# Patient Record
Sex: Female | Born: 1965
Health system: Southern US, Community
[De-identification: ages and names within clinical notes are randomized; demographics above are authoritative.]

## PROBLEM LIST (undated history)

## (undated) DIAGNOSIS — F419 Anxiety disorder, unspecified: Secondary | ICD-10-CM

## (undated) DIAGNOSIS — N3281 Overactive bladder: Secondary | ICD-10-CM

## (undated) DIAGNOSIS — E119 Type 2 diabetes mellitus without complications: Secondary | ICD-10-CM

## (undated) HISTORY — PX: TUBAL LIGATION: SHX77

## (undated) HISTORY — DX: Overactive bladder: N32.81

## (undated) HISTORY — DX: Anxiety disorder, unspecified: F41.9

---

## 1999-09-11 ENCOUNTER — Other Ambulatory Visit: Admission: RE | Admit: 1999-09-11 | Discharge: 1999-09-11 | Payer: Self-pay | Admitting: *Deleted

## 2001-02-03 ENCOUNTER — Inpatient Hospital Stay (HOSPITAL_COMMUNITY): Admission: AD | Admit: 2001-02-03 | Discharge: 2001-02-03 | Payer: Self-pay | Admitting: Obstetrics & Gynecology

## 2001-02-04 ENCOUNTER — Inpatient Hospital Stay (HOSPITAL_COMMUNITY): Admission: AD | Admit: 2001-02-04 | Discharge: 2001-02-04 | Payer: Self-pay | Admitting: *Deleted

## 2001-02-10 ENCOUNTER — Inpatient Hospital Stay (HOSPITAL_COMMUNITY): Admission: AD | Admit: 2001-02-10 | Discharge: 2001-02-10 | Payer: Self-pay | Admitting: Obstetrics & Gynecology

## 2001-03-25 ENCOUNTER — Inpatient Hospital Stay (HOSPITAL_COMMUNITY): Admission: AD | Admit: 2001-03-25 | Discharge: 2001-03-26 | Payer: Self-pay | Admitting: Obstetrics & Gynecology

## 2001-04-29 ENCOUNTER — Other Ambulatory Visit: Admission: RE | Admit: 2001-04-29 | Discharge: 2001-04-29 | Payer: Self-pay | Admitting: Obstetrics and Gynecology

## 2002-06-14 ENCOUNTER — Other Ambulatory Visit: Admission: RE | Admit: 2002-06-14 | Discharge: 2002-06-14 | Payer: Self-pay | Admitting: Family Medicine

## 2002-08-17 ENCOUNTER — Encounter: Payer: Self-pay | Admitting: Obstetrics and Gynecology

## 2002-08-17 ENCOUNTER — Ambulatory Visit (HOSPITAL_COMMUNITY): Admission: RE | Admit: 2002-08-17 | Discharge: 2002-08-17 | Payer: Self-pay | Admitting: Obstetrics and Gynecology

## 2003-07-17 ENCOUNTER — Other Ambulatory Visit: Admission: RE | Admit: 2003-07-17 | Discharge: 2003-07-17 | Payer: Self-pay | Admitting: Family Medicine

## 2004-03-01 ENCOUNTER — Other Ambulatory Visit: Admission: RE | Admit: 2004-03-01 | Discharge: 2004-03-01 | Payer: Self-pay | Admitting: Obstetrics and Gynecology

## 2004-08-13 ENCOUNTER — Encounter: Admission: RE | Admit: 2004-08-13 | Discharge: 2004-08-13 | Payer: Self-pay | Admitting: Obstetrics and Gynecology

## 2004-08-14 ENCOUNTER — Encounter: Admission: RE | Admit: 2004-08-14 | Discharge: 2004-08-14 | Payer: Self-pay | Admitting: Internal Medicine

## 2004-08-21 ENCOUNTER — Encounter: Admission: RE | Admit: 2004-08-21 | Discharge: 2004-08-21 | Payer: Self-pay | Admitting: *Deleted

## 2004-08-27 ENCOUNTER — Encounter: Admission: RE | Admit: 2004-08-27 | Discharge: 2004-08-27 | Payer: Self-pay | Admitting: *Deleted

## 2004-09-03 ENCOUNTER — Ambulatory Visit: Payer: Self-pay | Admitting: Obstetrics and Gynecology

## 2004-09-10 ENCOUNTER — Ambulatory Visit: Payer: Self-pay | Admitting: *Deleted

## 2004-09-17 ENCOUNTER — Ambulatory Visit: Payer: Self-pay | Admitting: *Deleted

## 2004-09-24 ENCOUNTER — Ambulatory Visit: Payer: Self-pay | Admitting: *Deleted

## 2004-10-01 ENCOUNTER — Ambulatory Visit: Payer: Self-pay | Admitting: *Deleted

## 2004-10-06 ENCOUNTER — Inpatient Hospital Stay (HOSPITAL_COMMUNITY): Admission: AD | Admit: 2004-10-06 | Discharge: 2004-10-06 | Payer: Self-pay | Admitting: Obstetrics and Gynecology

## 2004-10-08 ENCOUNTER — Ambulatory Visit: Payer: Self-pay | Admitting: *Deleted

## 2004-10-12 ENCOUNTER — Inpatient Hospital Stay (HOSPITAL_COMMUNITY): Admission: AD | Admit: 2004-10-12 | Discharge: 2004-10-14 | Payer: Self-pay | Admitting: Obstetrics and Gynecology

## 2004-11-20 ENCOUNTER — Other Ambulatory Visit: Admission: RE | Admit: 2004-11-20 | Discharge: 2004-11-20 | Payer: Self-pay | Admitting: Obstetrics and Gynecology

## 2004-12-26 ENCOUNTER — Observation Stay (HOSPITAL_COMMUNITY): Admission: RE | Admit: 2004-12-26 | Discharge: 2004-12-27 | Payer: Self-pay | Admitting: Obstetrics and Gynecology

## 2007-06-01 ENCOUNTER — Other Ambulatory Visit: Admission: RE | Admit: 2007-06-01 | Discharge: 2007-06-01 | Payer: Self-pay | Admitting: Gynecology

## 2008-11-09 ENCOUNTER — Ambulatory Visit: Payer: Self-pay | Admitting: Obstetrics and Gynecology

## 2008-11-16 ENCOUNTER — Ambulatory Visit (HOSPITAL_COMMUNITY): Admission: RE | Admit: 2008-11-16 | Discharge: 2008-11-16 | Payer: Self-pay | Admitting: Obstetrics & Gynecology

## 2008-11-30 ENCOUNTER — Ambulatory Visit: Payer: Self-pay | Admitting: Obstetrics and Gynecology

## 2009-01-19 ENCOUNTER — Encounter: Admission: RE | Admit: 2009-01-19 | Discharge: 2009-01-19 | Payer: Self-pay | Admitting: Pulmonary Disease

## 2009-11-14 ENCOUNTER — Emergency Department (HOSPITAL_COMMUNITY): Admission: EM | Admit: 2009-11-14 | Discharge: 2009-11-14 | Payer: Self-pay | Admitting: Family Medicine

## 2009-12-13 ENCOUNTER — Emergency Department (HOSPITAL_COMMUNITY): Admission: EM | Admit: 2009-12-13 | Discharge: 2009-12-13 | Payer: Self-pay | Admitting: Family Medicine

## 2010-04-12 ENCOUNTER — Ambulatory Visit: Payer: Self-pay | Admitting: Gynecology

## 2010-08-02 ENCOUNTER — Ambulatory Visit: Payer: Self-pay | Admitting: Women's Health

## 2011-05-13 NOTE — Group Therapy Note (Signed)
NAME:  Paige Villarreal, Paige Villarreal                    ACCOUNT NO.:  [id number]   MEDICAL RECORD NO.:  [id number]          PATIENT TYPE:  WOC   LOCATION:  [facility]                   FACILITY:  [facility]   PHYSICIAN:  Argentina Donovan, MD        DATE OF BIRTH:  [birth year]   DATE OF SERVICE:                                  CLINIC NOTE   The patient is a 45 year old Caucasian female who had a tubal ligation  some time ago by Dr. Lily Peer.  She had been constantly complaining of  lower abdominal pain, urinary incontinence not related to stress,  increase in weight and abdominal girth.  She had an ultrasound of the  pelvis a year ago which was completely normal.  She was told she had a  fibroid, but that was during the pregnancy, and it apparently has  shrunken down to where it is not visible.  She has been on Enablex and  Effexor, and she has recently stopped the Enablex on her own, as she  felt that this was causing her weight gain;  I do not think so.  If  there is anything that is causing it from her medications, it is  probably Effexor.  She was put on that by Dr. Lily Peer for her anxiety,  and it has worked fairly.   PHYSICAL EXAMINATION:  Her exam was fairly normal, see my previous note.  with a little suprapubic tenderness.  I had to come back today for  results of her CT of the pelvis and abdomen, and it was completely  normal, and also decide where to send her.  I think we should start with  the urologist because of her urinary incontinence and suprapubic pain.  I do not see any significant signs of gastric distension on the CT, and  the abdominal exam was benign.  I am going to try her on some Levsin.  She tried charcoal without any can help.  She has regular bowel  movements and is not bothered by constipation.  I counseled her to try  to get into Weight Watchers.  She said that she has friends who have  done that and been successful, so she is going to try and do that.  She  has stop smoking, and of  course had a 15-pound weight gain after that.  She certainly does not look grossly overweight, and I think a lot of  this weight gain that she says she is having in spite of watching her  diet may be imaginary.  I am going to try and talk to her about Xenical  and Alli, and I will give her a prescription for Xenical.  I warned her  about the possible side effects of loose stools, explosive diarrhea,  etc., if she overdid any fat intake, and she seems to understand that,  as well as vitamins.  She is also on multivitamins at the present time  and calcium.  She also takes potassium.  I told her I did not think that  that was necessary.  I did not know why she took it.  She said she was  placed on it years ago for leg cramps, and she does not have cramps when  she takes it, but she does when she stops it, so I am not sure exactly  what that is; it is an over-the-counter calcium product, she states, and  she does not know how much it in it.   IMPRESSION:  Mild suprapubic pain with mild urinary incontinence and  abdominal discomfort, gaseous distension per patient, with unexplained  weight gain.           ______________________________  Argentina Donovan, MD     PR/MEDQ  D:  11/30/2008  T:  11/30/2008  Job:  [id number]

## 2011-05-13 NOTE — Group Therapy Note (Signed)
NAME:  Paige Villarreal, Paige Villarreal                    ACCOUNT NO.:  [id number]   MEDICAL RECORD NO.:  [id number]          PATIENT TYPE:  WOC   LOCATION:  [facility]                   FACILITY:  [facility]   PHYSICIAN:  Argentina Donovan, MD        DATE OF BIRTH:  November 20, 1966   DATE OF SERVICE:  11/09/2008                                  CLINIC NOTE   The patient is a 45 year old Caucasian female who was a patient of Dr.  Lily Peer, who did a tubal ligation some time ago.  In the past year she  has been complaining of lower abdominal pain, she has been having  urinary incontinence not related to stress, jumping, coughing, laughing  and an increase in weight and abdominal girth.  With this complaint 1  year ago, she had an ultrasound of the pelvis which was essentially  normal with normal size uterus and normal ovaries.  She has continued to  have this problem.  It sounds as if she also has some digestive problems  although she has normal bowel movements every day but frequency of  urination has been a big problem.  They told her she probably had an  unstable bladder.  They put her on and the Enablex and Effexor.  She is  5 feet 2 and weighs 155 pounds. Said she has recently in the last few  months gained 18 pounds.  She was actually sent to [place] by the cancer  center after Pap smear, they notice nodules on her cervix.   On examination her abdomen is somewhat distended but tympanic with  active bowel sounds, tender in the suprapubic area but not right above  the symphysis but a little further up perhaps 5 cm.  There is no  guarding.  There is no rebound there and so it is in the midportion  there and slightly to the right.  Notably when she had a tubal ligation,  they had to keep her for 2 days because of uncontrollable pain with no  significant reason that was known for this.   On examination her external genitalia is normal.  BUN is within normal  limits.  Vagina is clean and well ruggated.  Cervix is clean and  parous  and I see no sign of Nabothian or other nodules that were described as  the reason for sending her here.  I am not sure exactly what is wrong  with this lady.  She could have interstitial cystitis.  She could have  some kind of spastic colon.  She wanted something she could use for gas.  I recommended charcoal capsules.  I am going to get a CT of her abdomen  and pelvis with and without contrast, have her come back in 2 weeks and  then we will decide what to do with her.  Do we do a laparoscopy which I  think is going to turn out to be negative?  Do we refer her to a  urologist which I think she probably is going to eventually need or  should gastroenterology be our first stop?  Will  try to make this  decision after we see the results of her CT.           ______________________________  Argentina Donovan, MD     PR/MEDQ  D:  11/09/2008  T:  11/10/2008  Job:  [id number]

## 2011-05-16 NOTE — H&P (Signed)
[facility]  Patient:    Paige Villarreal, Paige Villarreal                             MRN: [id number] Adm. Date:  [id number] Attending:  Genia Del Dictator:   Genia Del, M.D.                         History and Physical  PATIENT PROFILE:              The patient is a 45 year old G3, P0, A2, expected date of delivery by ultrasound April 03, 2001, at 38 weeks 5 days gestation.  REASON FOR ADMISSION:         Uterine contractions every three minutes since 1 a.m. on March 25, 2001.  HISTORY OF PRESENT ILLNESS:   The patient started feeling increasing pain with uterine contractions becoming regular every three minutes at 1 a.m.  She had no vaginal bleeding, no fluid leak, but was mentioning a little bit of greenish secretions.  Good fetal movements.  No PIH symptoms.  PAST MEDICAL HISTORY:         Negative.  PAST SURGICAL HISTORY:        TAB x 2 and adenoidectomy.  ALLERGIES:                    No known drug allergies.  MEDICATIONS:                  Prenatal vitamins.  SOCIAL HISTORY:               Married.  Smoker, decreased the amount of smoking during pregnancy.  OBSTETRICAL HISTORY:          In 1992 and 1995 therapeutic abortions x 2, no complication.  FAMILY HISTORY:               Noncontributory.  HISTORY OF PRESENT PREGNANCY:                    First trimester was within normal.  She was seen first at 9+ weeks.  Her first trimester labs showed a hemoglobin of 13.4, blood group B positive, antibody is negative, RPR nonreactive, HBsAg negative, HIV negative, rubella titer is equivocal, platelets 308, gonorrhea and Chlamydia negative.  In the second trimester she had a triple test which was within normal limits.  At 19+ weeks she had an ultrasound with normal review of anatomy, normal placenta and a cervix at 2.8 cm.  Ultrasound dating was used.  One-hour GTT was abnormal, three-hour GTT was within normal limits in the third trimester.  She showed  preterm cervical change at 31 weeks, her cervix being at 1 cm, 90% effaced, vertex -1.  She was given betamethasone at that time and was put on bed rest.  At 34+ weeks group B strep was done and it came back negative.  Her cervix remained stable at that dilatation and effacement until she reached 37+ weeks.  An ultrasound at 37+ weeks showed an estimated fetal weight at the 46th percentile with normal amniotic fluid index at the 60th percentile and normal Doppler.  REVIEW OF SYSTEMS:            Constitutional: Negative.  HEENT: Negative. Cardiovascular: Negative.  Respiratory: Negative.  GI: Negative.  Urologic: Negative.  Dermatologic: Negative.  Musculoskeletal: Negative.  Endocrine: Negative.  Neurologic: Negative.  PHYSICAL EXAMINATION:  GENERAL:                      In extreme pain with uterine contractions, but no apparent distress otherwise.  VITAL SIGNS:                  Temperature 98.9, pulse 87 and then 78, respiratory rate 22, blood pressure 154/96 in pain and then back to normal with a diastolic of 72.  LUNGS:                        Clear bilaterally.  HEART:                        Regular cardiac rhythm, no murmur.  ABDOMEN:                      Gravid with a uterine height at 38 cm, cephalic presentation.  VAGINAL EXAM:                 At maternity admission the cervix was 3-4 cm, 90% effaced, vertex -1.  Membranes were ruptured with clear fluid, confirmed by a positive fern.  EXTREMITIES:                  Lower limbs mild edema.  MONITORING:                   Uterine contractions every 2-3 minutes lasting 40-80 seconds and moderate in intensity.  Fetal heart rate 140 to 160, good variability, no decelerations.  IMPRESSION:                   1. Gravida 3, para 0, abortus 2 at 38 weeks                                  5 days gestation by ultrasound, in active                                  labor.                               2. Spontaneous rupture of  membranes.                               3. Fetal well-being reassuring.                               4. Group B Streptococcus negative.  PLAN:                         Admit to labor and delivery, continuous monitoring.  Expectant management towards probable vaginal delivery. DD:  03/25/01 TD:  03/25/01 Job: [id number]

## 2011-05-16 NOTE — H&P (Signed)
NAME:  Paige Villarreal, Paige Villarreal                    ACCOUNT NO.:  [id number]   MEDICAL RECORD NO.:  [id number]          PATIENT TYPE:  WOC   LOCATION:  [facility]                          FACILITY:  [facility]   PHYSICIAN:  James A. Ashley Royalty, M.D.DATE OF BIRTH:  05-22-1966   DATE OF ADMISSION:  12/26/2004  DATE OF DISCHARGE:  10/08/2004                                HISTORY & PHYSICAL   HISTORY OF PRESENT ILLNESS:  This is a 45 year old gravida 4, para 2, AB 2  status post vaginal delivery October 12, 2004.  The patient states desire  for attempt at permanent surgical sterilization.   MEDICATIONS:  None.   PAST MEDICAL HISTORY:  Medical:  Irritable bowel syndrome.  Surgical:  Negative.   ALLERGIES:  Negative.   FAMILY HISTORY:  Positive for hypertension.   SOCIAL HISTORY:  The patient is a smoker.  She denies significant alcohol  use.   REVIEW OF SYSTEMS:  Noncontributory.   PHYSICAL EXAMINATION:  GENERAL:  A well-developed, well-nourished, pleasant  white female in no acute distress.  VITAL SIGNS:  Afebrile.  Vital signs stable.  SKIN:  Warm and dry without lesions.  LYMPH:  There is no supraclavicular, cervical, or inguinal adenopathy.  HEENT:  Normocephalic.  NECK:  Supple without thyromegaly.  CHEST:  Lungs are clear.  CARDIAC:  Rate and rhythm regular without murmurs, gallops, or rubs.  BREASTS:  Deferred.  ABDOMEN:  Soft, nontender without masses or organomegaly.  Bowel sounds are  active.  MUSCULOSKELETAL:  Full range of motion without edema, cyanosis, or CVA  tenderness.  PELVIC:  Please see most recent office exam.  External genitalia within  normal limits.  Vagina and cervix are without gross lesions.  Bimanual  examination reveals the uterus to be approximately 8 x 4 x 4 cm and no  adnexal masses are palpable.   IMPRESSION:  Desire for attempt at permanent surgical sterilization.   PLAN:  Laparoscopic bilateral tubal sterilization procedure.  Risks,  benefits, complications, and  alternatives were discussed with the patient.  The permanency and failure rates of various techniques including but not  limited to Falope ring, bipolar cautery, and minilaparotomy with partial  salpingectomy discussed and accepted.  Questions identified and answered.      JAM/MEDQ  D:  12/25/2004  T:  12/25/2004  Job:  [id number]

## 2011-05-16 NOTE — H&P (Signed)
NAME:  Paige Villarreal, Paige Villarreal                    ACCOUNT NO.:  [id number]   MEDICAL RECORD NO.:  [id number]          PATIENT TYPE:  WOC   LOCATION:  [facility]                          FACILITY:  [facility]   PHYSICIAN:  Juan H. Lily Peer, M.D.DATE OF BIRTH:  Jul 17, 1966   DATE OF ADMISSION:  10/08/2004  DATE OF DISCHARGE:                                HISTORY & PHYSICAL   CHIEF COMPLAINT:  Spontaneous rupture of membranes with contractions.   HISTORY OF PRESENT ILLNESS:  The patient is a 45 year old, G4, P62, AB2,  currently 38-1/2 weeks' estimated gestational age with an estimated date of  confinement of October 23, 2004.  The patient was scheduled for induction  next week.  She presented to [facility] complaining of spontaneous  rupture of membranes at approximately 2000 hours this evening and  contractions.  On arrival, she was found to be 4 cm dilated, 90% effaced, +1  station with light meconium contracting every 2-3 minutes apart with a  reassuring fetal heart rate tracing.  Her temperature is 98.4, respirations  14, pulse 76, blood pressure 142/73.   PRENATAL COURSE:  The patient was offered genetic amniocentesis and declined  secondary to her advanced maternal age.  She is a chronic smoker and on  numerous occasions had been counseled and continued to smoke throughout her  pregnancy.  The patient with gestational diabetes, diet-controlled, and had  received nutritional counseling.  She had been followed with prenatal visits  in the office consisting of serial ultrasounds and antepartum testing  consisting of NSTs.   ALLERGIES:  No known drug allergies.   PAST MEDICAL HISTORY:  1.  Elective termination of pregnancy in 1993 and 1996, respectively.  2.  She had a normal spontaneous vaginal delivery at 40 weeks' gestation      with rapid labor.  Preterm labor with this pregnancy as well.  3.  Irritable bowel syndrome.   REVIEW OF SYSTEMS:  See form.   PHYSICAL EXAMINATION:  VITAL SIGNS:  As  described above.  HEENT:  Unremarkable.  NECK:  Supple.  Trachea midline.  No carotid bruits or thyromegaly.  LUNGS:  Clear to auscultation without any rhonchi or wheezes.  HEART:  Regular rate and rhythm without any murmurs, rubs or gallops.  BREASTS:  Not done.  ABDOMEN:  Gravid uterus.  Vertex presentation by [facility] maneuver.  PELVIC:  Cervix 3-4 cm, 90%, +1 station, gross rupture of membranes with  slight meconium.  EXTREMITIES:  Deep tendon reflexes 1+.   PRENATAL LABORATORY DATA:  Blood type B positive.  Negative antibody screen.  VDRL nonreactive.  Rubella immune.  Hepatitis B surface antigen negative.  Pap smear was normal.  GC and Chlamydia culture was negative.  GBS culture  was negative.  Cystic fibrosis screen was negative.  Alpha fetoprotein was  reported to be normal.   ASSESSMENT:  A 45 year old, G4, P1, AB2 at 38-1/2 weeks' estimated  gestational age with spontaneous rupture of membranes at 2000 hours today,  October 15, in active labor and advanced cervical dilatation consisting with  cervix 3-4 cm, 90% effaced, +  1 station, light meconium stained amniotic  fluid, reassuring fetal heart rate tracing, contractions every 2-3 minutes  apart.  Group B Streptococcus culture was negative.  Anticipate vaginal  delivery.   PLAN:  As per assessment above.      JHF/MEDQ  D:  10/12/2004  T:  10/13/2004  Job:  [id number]   cc:   Fayrene Fearing A. Ashley Royalty, M.D.  [address]  [place], [place] [address]  Fax: [phone]

## 2011-05-16 NOTE — Discharge Summary (Signed)
NAME:  Paige Villarreal, Paige Villarreal                    ACCOUNT NO.:  [id number]   MEDICAL RECORD NO.:  [id number]          PATIENT TYPE:  INP   LOCATION:  9133                          [facility]   PHYSICIAN:  James A. Ashley Royalty, M.D.DATE OF BIRTH:  [birth year]   DATE OF ADMISSION:  10/12/2004  DATE OF DISCHARGE:  10/14/2004                                 DISCHARGE SUMMARY   DISCHARGE DIAGNOSES:  1.  Intrauterine pregnancy at term, delivered.  2.  Spontaneous rupture of membranes.  3.  Meconium-stained amniotic fluid.  4.  Term birth of living child, vertex.   OPERATIONS AND SPECIAL PROCEDURES:  Obstetrical delivery.   CONSULTATIONS:  None.   DISCHARGE MEDICATIONS:  Chromagen, Motrin.   HISTORY AND PHYSICAL:  This is a 45 year old gravida 4, para 1, Ab2, 38-1/[redacted]  weeks gestation, who presented with the aforementioned findings, who was  admitted for delivery.  For the remainder of the history and physical,  please seed chart.   HOSPITAL COURSE:  The patient was admitted to [facility]  [place].  Admission laboratory studies were drawn.  She went on to labor  and delivered on October 12, 2004.  The infant was a 7-pound 8-ounce female,  Apgars 9 at 1 minute and 9 at 5 minutes, sent to the newborn nursery.  Delivery was accomplished by Dr. Gaetano Hawthorne. Paige Villarreal.  The patient's  postpartum course was benign.  She was discharged on the second postpartum  day, afebrile and in satisfactory condition.   DISPOSITION:  The patient is to return to [facility] and obstetrics  in 4-6 weeks for postpartum evaluation.      JAM/MEDQ  D:  11/21/2004  T:  11/22/2004  Job:  [id number]

## 2011-05-16 NOTE — Op Note (Signed)
NAME:  Paige Villarreal, Paige Villarreal                    ACCOUNT NO.:  [id number]   MEDICAL RECORD NO.:  [id number]          PATIENT TYPE:  OBV   LOCATION:  9320                          [facility]   PHYSICIAN:  James A. Ashley Royalty, M.D.DATE OF BIRTH:  Jan 01, 1966   DATE OF PROCEDURE:  12/26/2004  DATE OF DISCHARGE:                                 OPERATIVE REPORT   PREOPERATIVE DIAGNOSIS:  Desire for attempted permanent surgical  sterilization.   POSTOPERATIVE DIAGNOSIS:  Desire for attempted permanent surgical  sterilization.   PROCEDURE:  Laparoscopic bilateral tubal sterilization procedure (Falope  rings).   SURGEON:  Rudy Jew. Ashley Royalty, M.D.   ANESTHESIA:  General.   ESTIMATED BLOOD LOSS:  25 cc.   COMPLICATIONS:  None.   PACKS AND DRAINS:  None.   DESCRIPTION OF PROCEDURE:  The patient was taken to the operating room and  placed in the dorsal supine position.  After general anesthesia was  administered, she was placed in the lithotomy position and prepped and  draped in the usual manner for abdominal and vaginal surgery.  A posterior  weighted retractor was placed per vagina.  The anterior lip of the cervix  was grasped with a single-tooth tenaculum.  The Charcot uterine manipulator  was placed per cervix and held in place with a tenaculum.  The bladder was  drained with a red rubber catheter.  The draping was completed.   Next, a 1.5-cm infraumbilical incision was made.  The Veress needle was  introduced into the abdominal cavity.  Its location was verified by  instillation of saline and hanging drop techniques.  Approximately 3 L of  CO2 were instilled at 1 L per minute to create a pneumoperitoneum.  The  disposable size 10/11 laparoscopic trocar was placed in the abdominal  cavity.  Its location was verified by placement of the laparoscope.  There  was no evidence of any trauma.  Pneumoperitoneum was maintained throughout.  The pelvic contents were displayed.  The uterus was normal  size, shape, and  contour and without evidence of any endometriosis or fibroids.  The ovaries  were normal size, shape, and contour and without evidence of any cysts or  endometriosis.  The fallopian tubes were normal size, shape, contour, and  length, with luxuriant fimbria.  The remainder of the peritoneal surfaces  were smooth and glistening.  No abnormalities were noted.  The 8-mm Falope  ring trocar was introduced suprapubically using transillumination and direct  visualization techniques.  Its location was in the midline.  The Falope ring  applicator was placed through the trocar.  The right fallopian tube was  grasped and traced to its fimbriated end.  An avascular area in the distal  isthmic to proximal ampullary portion was chosen for ring placement.  The  Falope ring was applied without difficulty.  An excellent knuckle of tube  was noted to be contained within the ring.  Excellent blanching of tissue  was noted.  Appropriate photos were obtained before and after.  The left  fallopian tube was then grasped and traced to its  fimbriated end.  An  avascular area in the distal isthmic to proximal ampullary portion was  chosen for ring placement.  The Falope ring was applied without difficulty.  An excellent knuckle of tube was noted to be contained within the ring.  Excellent blanching of tissue was noted.  Appropriate photos were obtained.   At this point, the patient was felt to have benefited maximally from the  surgical procedure.  The abdominal instruments were removed and  pneumoperitoneum evacuated.  The fascial defect was closed with 0 Vicryl in  an interrupted fashion.  The skin was closed with 3-0 Monocryl in a  subcuticular fashion.  The vaginal instruments were removed.  A small  cervical laceration was easily closed with a 3-0 chromic.  Hemostasis was  noted and the procedure terminated.   The patient tolerated the procedure extremely well and was returned to the   recovery room in good condition.      JAM/MEDQ  D:  12/31/2004  T:  12/31/2004  Job:  [id number]

## 2011-06-17 ENCOUNTER — Other Ambulatory Visit (HOSPITAL_COMMUNITY)
Admission: RE | Admit: 2011-06-17 | Discharge: 2011-06-17 | Disposition: A | Discharge status: Home / Self Care | Payer: PRIVATE HEALTH INSURANCE | Source: Ambulatory Visit | Attending: Gynecology | Admitting: Gynecology

## 2011-06-17 ENCOUNTER — Other Ambulatory Visit: Payer: Self-pay | Admitting: Gynecology

## 2011-06-17 ENCOUNTER — Encounter (INDEPENDENT_AMBULATORY_CARE_PROVIDER_SITE_OTHER): Payer: PRIVATE HEALTH INSURANCE | Admitting: Gynecology

## 2011-06-17 DIAGNOSIS — R635 Abnormal weight gain: Secondary | ICD-10-CM

## 2011-06-17 DIAGNOSIS — Z833 Family history of diabetes mellitus: Secondary | ICD-10-CM

## 2011-06-17 DIAGNOSIS — N949 Unspecified condition associated with female genital organs and menstrual cycle: Secondary | ICD-10-CM

## 2011-06-17 DIAGNOSIS — Z124 Encounter for screening for malignant neoplasm of cervix: Secondary | ICD-10-CM | POA: Insufficient documentation

## 2011-06-17 DIAGNOSIS — Z01419 Encounter for gynecological examination (general) (routine) without abnormal findings: Secondary | ICD-10-CM

## 2011-06-20 ENCOUNTER — Encounter: Payer: Self-pay | Admitting: Gynecology

## 2011-06-24 ENCOUNTER — Other Ambulatory Visit: Payer: Self-pay | Admitting: Gynecology

## 2011-06-24 ENCOUNTER — Ambulatory Visit (INDEPENDENT_AMBULATORY_CARE_PROVIDER_SITE_OTHER): Payer: PRIVATE HEALTH INSURANCE | Admitting: Gynecology

## 2011-06-24 ENCOUNTER — Other Ambulatory Visit: Payer: PRIVATE HEALTH INSURANCE

## 2011-06-24 DIAGNOSIS — R82998 Other abnormal findings in urine: Secondary | ICD-10-CM

## 2011-06-24 DIAGNOSIS — Z833 Family history of diabetes mellitus: Secondary | ICD-10-CM

## 2011-06-24 DIAGNOSIS — N949 Unspecified condition associated with female genital organs and menstrual cycle: Secondary | ICD-10-CM

## 2011-06-24 DIAGNOSIS — Z124 Encounter for screening for malignant neoplasm of cervix: Secondary | ICD-10-CM

## 2011-06-24 DIAGNOSIS — Z13220 Encounter for screening for lipoid disorders: Secondary | ICD-10-CM

## 2011-06-24 DIAGNOSIS — D259 Leiomyoma of uterus, unspecified: Secondary | ICD-10-CM

## 2011-06-24 DIAGNOSIS — N8300 Follicular cyst of ovary, unspecified side: Secondary | ICD-10-CM

## 2011-09-30 LAB — POCT URINALYSIS DIP (DEVICE)
Bilirubin Urine: NEGATIVE
Glucose, UA: NEGATIVE
Hgb urine dipstick: NEGATIVE
Ketones, ur: NEGATIVE
Nitrite: NEGATIVE
Operator id: 297281
Protein, ur: NEGATIVE
Specific Gravity, Urine: 1.015
Urobilinogen, UA: 0.2
pH: 7.5

## 2011-12-02 ENCOUNTER — Other Ambulatory Visit: Payer: Self-pay | Admitting: *Deleted

## 2011-12-02 MED ORDER — FLUOXETINE HCL 10 MG PO CAPS: 10.0000 mg | ORAL_CAPSULE | Freq: Every day | ORAL | Status: DC

## 2011-12-02 NOTE — Telephone Encounter (Signed)
Pt called wanting refill on prozac 10 mg, last office visit on 06/24/11. Please advise.

## 2011-12-02 NOTE — Telephone Encounter (Signed)
PT INFORMED WITH THE BELOW NOTE. 

## 2012-04-21 ENCOUNTER — Other Ambulatory Visit: Payer: Self-pay | Admitting: Gynecology

## 2012-04-21 ENCOUNTER — Ambulatory Visit (INDEPENDENT_AMBULATORY_CARE_PROVIDER_SITE_OTHER): Payer: BC Managed Care – PPO | Admitting: Gynecology

## 2012-04-21 ENCOUNTER — Encounter: Payer: Self-pay | Admitting: Gynecology

## 2012-04-21 VITALS — BP 128/84

## 2012-04-21 DIAGNOSIS — R141 Gas pain: Secondary | ICD-10-CM

## 2012-04-21 DIAGNOSIS — N939 Abnormal uterine and vaginal bleeding, unspecified: Secondary | ICD-10-CM

## 2012-04-21 DIAGNOSIS — R350 Frequency of micturition: Secondary | ICD-10-CM

## 2012-04-21 DIAGNOSIS — R142 Eructation: Secondary | ICD-10-CM

## 2012-04-21 DIAGNOSIS — R140 Abdominal distension (gaseous): Secondary | ICD-10-CM

## 2012-04-21 DIAGNOSIS — N390 Urinary tract infection, site not specified: Secondary | ICD-10-CM

## 2012-04-21 DIAGNOSIS — N926 Irregular menstruation, unspecified: Secondary | ICD-10-CM

## 2012-04-21 DIAGNOSIS — N938 Other specified abnormal uterine and vaginal bleeding: Secondary | ICD-10-CM

## 2012-04-21 LAB — URINALYSIS W MICROSCOPIC + REFLEX CULTURE
Bilirubin Urine: NEGATIVE
Casts: NONE SEEN
Crystals: NONE SEEN
Glucose, UA: NEGATIVE mg/dL
Ketones, ur: NEGATIVE mg/dL
Nitrite: NEGATIVE
Protein, ur: NEGATIVE mg/dL
Specific Gravity, Urine: 1.01 (ref 1.005–1.030)
Urobilinogen, UA: 0.2 mg/dL (ref 0.0–1.0)
pH: 7 (ref 5.0–8.0)

## 2012-04-21 MED ORDER — URIBEL 118 MG PO CAPS: 118.0000 mg | ORAL_CAPSULE | Freq: Four times a day (QID) | ORAL | Status: DC

## 2012-04-21 MED ORDER — NITROFURANTOIN MONOHYD MACRO 100 MG PO CAPS: 100.0000 mg | ORAL_CAPSULE | Freq: Two times a day (BID) | ORAL | Status: AC

## 2012-04-21 NOTE — Progress Notes (Signed)
Patient presented to the office today with several complaints. Her for complaint was dysuria and frequency for the past few days. She denies any fever chills nausea vomiting or any back pain. The second complaints has been irregular bleeding over the course of the past 2 months. Typically her cycles last 25 days. Review of her record indicated that a year ago she had a sonohysterogram and endometrial biopsy which was benign. Patient's had a previous tubal ligation procedure. Her last Pap smear was in 2012 which was normal also.  Exam: Abdomen: Soft nontender no rebound no guarding Pelvic: Bartholin urethra Skene was within normal limits Vagina: No lesions or discharge Uterus: Anteverted difficult to assess due to patient's vaginismus and and guarding. Adnexa: Difficult to assess due to patient's vaginismus Rectal: Not examined  Urinalysis demonstrated 2 numerous to count WBC many bacteria in 3-6 RBC.  Assessment/plan: Since patient feels that she is bloated and for past several months she has had these symptoms different from the urinary tract infection symptoms that she has most recent I would like for her to return to the office next week for an ultrasound for better assessment of her uterus and adnexa. For her urinary tract infection she will be placed on Macrobid one by mouth twice a day for 7 days along with Uribell (anti-spasmodic agent) to take 1 by mouth 3 times a day or 4 times a day for the next 3-4 days. She will maintain a menstrual calendar for the next several months and she will bring to the office when she comes for her annual exam in June or July to determine if she is going to need another sonohysterogram and or endometrial biopsy.

## 2012-04-21 NOTE — Patient Instructions (Signed)

## 2012-04-26 LAB — URINE CULTURE: Colony Count: >=100000

## 2012-04-27 ENCOUNTER — Other Ambulatory Visit: Payer: Self-pay | Admitting: Gynecology

## 2012-04-27 ENCOUNTER — Telehealth: Payer: Self-pay | Admitting: Gynecology

## 2012-04-27 DIAGNOSIS — N390 Urinary tract infection, site not specified: Secondary | ICD-10-CM

## 2012-04-27 MED ORDER — CIPROFLOXACIN HCL 250 MG PO TABS: 250.0000 mg | ORAL_TABLET | Freq: Two times a day (BID) | ORAL | Status: AC

## 2012-04-27 MED ORDER — URIBEL 118 MG PO CAPS: 118.0000 mg | ORAL_CAPSULE | Freq: Four times a day (QID) | ORAL | Status: DC

## 2012-04-27 MED ORDER — CIPROFLOXACIN HCL 250 MG PO TABS: 250.0000 mg | ORAL_TABLET | Freq: Three times a day (TID) | ORAL | Status: DC

## 2012-04-27 NOTE — Telephone Encounter (Signed)
Make sure she is contracepting. I would like for her to take Cipro 250 Mg bid for 3 days since the macrodantin she was taking had intermediate sensitivity to  the organisms isolated. That would cover it. When she returns next week for ultrasound will recheck her urine.

## 2012-04-27 NOTE — Telephone Encounter (Signed)
I e-scribed Uribel per Dr. Glenetta Hew verbal okay.  Patient's u/s is this Friday and she will still be on Cipro so I told her just to come by next week to recheck urine culture.  Order is in system.

## 2012-04-27 NOTE — Telephone Encounter (Addendum)
She said she has had tubal ligation.  She said she talked with you about how she feels pregnant but that you felt like her symptoms were related to her fibroid.  She is coming for u/s on Friday.    I imagine you will want her to wait until next week to re-check urine culture since she will probably still be taking Cipro at u/s?  Will it be okay to refill her Uribel for her as well?

## 2012-04-27 NOTE — Telephone Encounter (Signed)
Patient informed of below.  Patient said that she takes the last of her Macrobid and [employer].  She has noticed in the last two days some back pain right below her waist.  She said she did clean house Saturday and it may just be she did too much be sure is concerned regarding her kidneys.  She also said that when she lapses on taking the Uribel that she still is having bladder spasms.  She said she must admit she is a little concerned what is going to happen tomorrow when she runs out of both the meds.  What to recommend?

## 2012-04-27 NOTE — Telephone Encounter (Signed)
Message copied by Keenan Bachelor on Tue Apr 27, 2012 11:31 AM ------      Message from: Ok Edwards      Created: Mon Apr 26, 2012  4:37 PM       Please inform patient that when she finishes the antibiotic that I placed her on that I would like for her to return for followup urine culture. Her urine culture and sensitivity indicated that the Macrobid which she is on has intermediate sensitivity to the microorganisms isolated. I just want to make sure that her infection has been completely eradicated.

## 2012-04-28 ENCOUNTER — Telehealth: Payer: Self-pay | Admitting: *Deleted

## 2012-04-28 NOTE — Telephone Encounter (Signed)
Coupon for uribel left up front for pt to pick up.

## 2012-04-30 ENCOUNTER — Ambulatory Visit (INDEPENDENT_AMBULATORY_CARE_PROVIDER_SITE_OTHER): Payer: BC Managed Care – PPO | Admitting: Gynecology

## 2012-04-30 ENCOUNTER — Ambulatory Visit (INDEPENDENT_AMBULATORY_CARE_PROVIDER_SITE_OTHER): Payer: BC Managed Care – PPO

## 2012-04-30 DIAGNOSIS — R5383 Other fatigue: Secondary | ICD-10-CM

## 2012-04-30 DIAGNOSIS — R5381 Other malaise: Secondary | ICD-10-CM

## 2012-04-30 DIAGNOSIS — D259 Leiomyoma of uterus, unspecified: Secondary | ICD-10-CM

## 2012-04-30 DIAGNOSIS — R140 Abdominal distension (gaseous): Secondary | ICD-10-CM

## 2012-04-30 DIAGNOSIS — R635 Abnormal weight gain: Secondary | ICD-10-CM

## 2012-04-30 DIAGNOSIS — R29898 Other symptoms and signs involving the musculoskeletal system: Secondary | ICD-10-CM

## 2012-04-30 DIAGNOSIS — R141 Gas pain: Secondary | ICD-10-CM

## 2012-04-30 DIAGNOSIS — M6289 Other specified disorders of muscle: Secondary | ICD-10-CM

## 2012-04-30 DIAGNOSIS — Z8632 Personal history of gestational diabetes: Secondary | ICD-10-CM | POA: Insufficient documentation

## 2012-04-30 DIAGNOSIS — N926 Irregular menstruation, unspecified: Secondary | ICD-10-CM

## 2012-04-30 DIAGNOSIS — N938 Other specified abnormal uterine and vaginal bleeding: Secondary | ICD-10-CM

## 2012-04-30 LAB — CBC WITH DIFFERENTIAL/PLATELET
Basophils Absolute: 0 10*3/uL (ref 0.0–0.1)
Basophils Relative: 1 % (ref 0–1)
Eosinophils Absolute: 0.1 10*3/uL (ref 0.0–0.7)
Eosinophils Relative: 2 % (ref 0–5)
HCT: 38.7 % (ref 36.0–46.0)
Hemoglobin: 12.6 g/dL (ref 12.0–15.0)
Lymphocytes Relative: 23 % (ref 12–46)
Lymphs Abs: 1.9 10*3/uL (ref 0.7–4.0)
MCH: 29.3 pg (ref 26.0–34.0)
MCHC: 32.6 g/dL (ref 30.0–36.0)
MCV: 90 fL (ref 78.0–100.0)
Monocytes Absolute: 0.4 10*3/uL (ref 0.1–1.0)
Monocytes Relative: 5 % (ref 3–12)
Neutro Abs: 5.7 10*3/uL (ref 1.7–7.7)
Neutrophils Relative %: 70 % (ref 43–77)
Platelets: 373 10*3/uL (ref 150–400)
RBC: 4.3 MIL/uL (ref 3.87–5.11)
RDW: 13.2 % (ref 11.5–15.5)
WBC: 8.2 10*3/uL (ref 4.0–10.5)

## 2012-04-30 LAB — COMPREHENSIVE METABOLIC PANEL
ALT: 35 U/L (ref 0–35)
AST: 25 U/L (ref 0–37)
Albumin: 4.4 g/dL (ref 3.5–5.2)
Alkaline Phosphatase: 67 U/L (ref 39–117)
BUN: 7 mg/dL (ref 6–23)
CO2: 30 mEq/L (ref 19–32)
Calcium: 9.1 mg/dL (ref 8.4–10.5)
Chloride: 101 mEq/L (ref 96–112)
Creat: 0.6 mg/dL (ref 0.50–1.10)
Glucose, Bld: 84 mg/dL (ref 70–99)
Potassium: 3.9 mEq/L (ref 3.5–5.3)
Sodium: 137 mEq/L (ref 135–145)
Total Bilirubin: 0.8 mg/dL (ref 0.3–1.2)
Total Protein: 6.7 g/dL (ref 6.0–8.3)

## 2012-04-30 LAB — LIPID PANEL
Cholesterol: 234 mg/dL — ABNORMAL HIGH (ref 0–200)
HDL: 55 mg/dL (ref >39)
LDL Cholesterol: 148 mg/dL — ABNORMAL HIGH (ref 0–99)
Total CHOL/HDL Ratio: 4.3 Ratio
Triglycerides: 155 mg/dL — ABNORMAL HIGH (ref <150)
VLDL: 31 mg/dL (ref 0–40)

## 2012-04-30 LAB — TSH: TSH: 0.772 u[IU]/mL (ref 0.350–4.500)

## 2012-04-30 NOTE — Patient Instructions (Addendum)
Irritable Bowel Syndrome   Irritable Bowel Syndrome (IBS) is caused by a disturbance of normal bowel function. Other terms used are spastic colon, mucous colitis, and irritable colon. It does not require surgery, nor does it lead to cancer. There is no cure for IBS. But with proper diet, stress reduction, and medication, you will find that your problems (symptoms) will gradually disappear or improve. IBS is a common digestive disorder. It usually appears in late adolescence or early adulthood. Women develop it twice as often as men. CAUSES  After food has been digested and absorbed in the small intestine, waste material is moved into the colon (large intestine). In the colon, water and salts are absorbed from the undigested products coming from the small intestine. The remaining residue, or fecal material, is held for elimination. Under normal circumstances, gentle, rhythmic contractions on the bowel walls push the fecal material along the colon towards the rectum. In IBS, however, these contractions are irregular and poorly coordinated. The fecal material is either retained too long, resulting in constipation, or expelled too soon, producing diarrhea. SYMPTOMS  The most common symptom of IBS is pain. It is typically in the lower left side of the belly (abdomen). But it may occur anywhere in the abdomen. It can be felt as heartburn, backache, or even as a dull pain in the arms or shoulders. The pain comes from excessive bowel-muscle spasms and from the buildup of gas and fecal material in the colon. This pain:  Can range from sharp belly (abdominal) cramps to a dull, continuous ache.   Usually worsens soon after eating.   Is typically relieved by having a bowel movement or passing gas.  Abdominal pain is usually accompanied by constipation. But it may also produce diarrhea. The diarrhea typically occurs right after a meal or upon arising in the morning. The stools are typically soft and watery. They  are often flecked with secretions (mucus). Other symptoms of IBS include:  Bloating.   Loss of appetite.   Heartburn.   Feeling sick to your stomach (nausea).   Belching   Vomiting   Gas.  IBS may also cause a number of symptoms that are unrelated to the digestive system:  Fatigue.   Headaches.   Anxiety   Shortness of breath   Difficulty in concentrating.   Dizziness.  These symptoms tend to come and go. DIAGNOSIS  The symptoms of IBS closely mimic the symptoms of other, more serious digestive disorders. So your caregiver may wish to perform a variety of additional tests to exclude these disorders. He/she wants to be certain of learning what is wrong (diagnosis). The nature and purpose of each test will be explained to you. TREATMENT A number of medications are available to help correct bowel function and/or relieve bowel spasms and abdominal pain. Among the drugs available are:  Mild, non-irritating laxatives for severe constipation and to help restore normal bowel habits.   Specific anti-diarrheal medications to treat severe or prolonged diarrhea.   Anti-spasmodic agents to relieve intestinal cramps.   Your caregiver may also decide to treat you with a mild tranquilizer or sedative during unusually stressful periods in your life.  The important thing to remember is that if any drug is prescribed for you, make sure that you take it exactly as directed. Make sure that your caregiver knows how well it worked for you. HOME CARE INSTRUCTIONS   Avoid foods that are high in fat or oils. Some examples [id number] cream, butter, frankfurters, sausage, and  other fatty meats.   Avoid foods that have a laxative effect, such as fruit, fruit juice, and dairy products.   Cut out carbonated drinks, chewing gum, and "gassy" foods, such as beans and cabbage. This may help relieve bloating and belching.   Bran taken with plenty of liquids may help relieve constipation.   Keep track  of what foods seem to trigger your symptoms.   Avoid emotionally charged situations or circumstances that produce anxiety.   Start or continue exercising.   Get plenty of rest and sleep.  MAKE SURE YOU:   Understand these instructions.   Will watch your condition.   Will get help right away if you are not doing well or get worse.  Document Released: 12/15/2005 Document Revised: 12/04/2011 Document Reviewed: 08/04/2008  .Lactose Intolerance, Adult  Lactose intolerance is when the body is not able to digest lactose, a sugar found in milk and milk products. Lactose intolerance is caused by your body not producing enough of the enzyme lactase. When there is not enough lactase to digest the amount of lactose consumed, discomfort may be felt. Lactose intolerance is not a milk allergy. For most people, lactase deficiency is a condition that develops naturally over time. After about the age of 2, the body begins to produce less lactase. But many people may not experience symptoms until they are much older. CAUSES Things that can cause you to be lactose intolerant include:  Aging.   Being born without the ability to make lactase.   Certain digestive diseases.   Injuries to the small intestine.  SYMPTOMS   Feeling sick to your stomach (nauseous).   Diarrhea.   Cramps.   Bloating.   Gas.  Symptoms usually show up a half hour or 2 hours after eating or drinking products containing lactose. TREATMENT  No treatment can improve the body's ability to produce lactase. However, symptoms can be controlled through diet. A medicine may be given to you to take when you consume lactose-containing foods or drinks. The medicine contains the lactase enzyme, which help the body digest lactose better. HOME CARE INSTRUCTIONS  Eat or drink dairy products as told by your caregiver or dietician.   Take all medicine as directed by your caregiver.   Find lactose-free or lactose-reduced products at  your local grocery store.   Talk to your caregiver or dietician to decide if you need any dietary supplements.  The following is the amount of calcium needed from the diet:  19 to 50 years: 1000 mg   Over 50 years: 1200 mg  Calcium and Lactose in Common Foods Non-Dairy Products / Calcium Content (mg)  Calcium-fortified orange juice, 1 cup / 308 to 344 mg   Sardines, with edible bones, 3 oz / 270 mg   Salmon, canned, with edible bones, 3 oz / 205 mg   Soymilk, fortified, 1 cup / 200 mg   Broccoli (raw), 1 cup / 90 mg   Orange, 1 medium / 50 mg   Pinto beans,  cup / 40 mg   Tuna, canned, 3 oz / 10 mg   Lettuce greens,  cup / 10 mg  Dairy Products / Calcium Content (mg) / Lactose Content (g)  Yogurt, plain, low-fat, 1 cup / 415 mg / 5 g   Milk, reduced fat, 1 cup / 295 mg / 11 g   Swiss cheese, 1 oz / 270 mg / 1 g   Ice cream,  cup / 85 mg / 6 g  Cottage cheese,  cup / 75 mg / 2 to 3 g  SEEK MEDICAL CARE IF: You have no relief from your symptoms. Document Released: 12/15/2005 Document Revised: 12/04/2011 Document Reviewed: 03/14/2011 [employer] Patient Information 2012 [place], [place].  Urinary Tract Infection Infections of the urinary tract can start in several places. A bladder infection (cystitis), a kidney infection (pyelonephritis), and a prostate infection (prostatitis) are different types of urinary tract infections (UTIs). They usually get better if treated with medicines (antibiotics) that kill germs. Take all the medicine until it is gone. You or your child may feel better in a few days, but TAKE ALL MEDICINE or the infection may not respond and may become more difficult to treat. HOME CARE INSTRUCTIONS   Drink enough water and fluids to keep the urine clear or pale yellow. Cranberry juice is especially recommended, in addition to large amounts of water.   Avoid caffeine, tea, and carbonated beverages. They tend to irritate the bladder.   Alcohol may  irritate the prostate.   Only take over-the-counter or prescription medicines for pain, discomfort, or fever as directed by your caregiver.  To prevent further infections:  Empty the bladder often. Avoid holding urine for long periods of time.   After a bowel movement, women should cleanse from front to back. Use each tissue only once.   Empty the bladder before and after sexual intercourse.  FINDING OUT THE RESULTS OF YOUR TEST Not all test results are available during your visit. If your or your child's test results are not back during the visit, make an appointment with your caregiver to find out the results. Do not assume everything is normal if you have not heard from your caregiver or the medical facility. It is important for you to follow up on all test results. SEEK MEDICAL CARE IF:   There is back pain.   Your baby is older than 3 months with a rectal temperature of 100.5 F (38.1 C) or higher for more than 1 day.   Your or your child's problems (symptoms) are no better in 3 days. Return sooner if you or your child is getting worse.  SEEK IMMEDIATE MEDICAL CARE IF:   There is severe back pain or lower abdominal pain.   You or your child develops chills.   You have a fever.   Your baby is older than 3 months with a rectal temperature of 102 F (38.9 C) or higher.   Your baby is 76 months old or younger with a rectal temperature of 100.4 F (38 C) or higher.   There is nausea or vomiting.   There is continued burning or discomfort with urination.  MAKE SURE YOU:   Understand these instructions.   Will watch your condition.   Will get help right away if you are not doing well or get worse.  Document Released: 09/24/2005 Document Revised: 12/04/2011 Document Reviewed: 04/29/2007 [facility] Patient Information 2012 [place], [place].

## 2012-04-30 NOTE — Progress Notes (Signed)
46 year old who was seen the office on April 24 for signs and symptoms consisting of urinary tract infection. She was started on Macrobid and sensitivity was intermediate so when she completed the antibiotics she was started on Cipro 250 mg twice a day for 3 days. She states that her urinary tract symptoms haven't improved but she has been complaining of abdominal bloating sensation and has had episodes of diarrhea on and off for the past several months as well. Due to limited pelvic examination and ultrasound was done and this is the reason for her office visit today.  Ultrasound: Uterus 10 x 6 x 4.5 cm with endometrial stripe of 9.4 mm 2 small fibroids one measuring 17 x 12 mm and the second one measuring 12 x 10 mm. Ovaries appeared to be normal. A small follicle the right ovary measuring 26 x 26 x 23 mm was noted.  Assessment/plan: With patient's history of abdominal bloating along with weight gain tiredness and muscle fatigue will order the following labs as today: CBC, comprehensive metabolic panel, lipid profile, and TSH and vitamin D. She will be referred back to her gastroenterologist for further evaluation for possible lactose intolerance, or IBS.

## 2012-05-01 LAB — VITAMIN D 25 HYDROXY (VIT D DEFICIENCY, FRACTURES): Vit D, 25-Hydroxy: 25 ng/mL — ABNORMAL LOW (ref 30–89)

## 2012-05-04 ENCOUNTER — Other Ambulatory Visit: Payer: Self-pay | Admitting: *Deleted

## 2012-05-04 DIAGNOSIS — E7800 Pure hypercholesterolemia, unspecified: Secondary | ICD-10-CM

## 2012-05-04 MED ORDER — ERGOCALCIFEROL 1.25 MG (50000 UT) PO CAPS: 50000.0000 [IU] | ORAL_CAPSULE | ORAL | Status: AC

## 2012-05-06 ENCOUNTER — Other Ambulatory Visit: Payer: BC Managed Care – PPO

## 2012-05-06 DIAGNOSIS — N390 Urinary tract infection, site not specified: Secondary | ICD-10-CM

## 2012-05-07 LAB — URINALYSIS W MICROSCOPIC + REFLEX CULTURE
Bacteria, UA: NONE SEEN
Bilirubin Urine: NEGATIVE
Casts: NONE SEEN
Crystals: NONE SEEN
Glucose, UA: NEGATIVE mg/dL
Hgb urine dipstick: NEGATIVE
Ketones, ur: NEGATIVE mg/dL
Nitrite: NEGATIVE
Protein, ur: NEGATIVE mg/dL
Specific Gravity, Urine: 1.011 (ref 1.005–1.030)
Urobilinogen, UA: 0.2 mg/dL (ref 0.0–1.0)
pH: 7.5 (ref 5.0–8.0)

## 2012-05-09 LAB — URINE CULTURE: Colony Count: 65000

## 2012-05-13 ENCOUNTER — Telehealth: Payer: Self-pay | Admitting: *Deleted

## 2012-05-13 NOTE — Telephone Encounter (Signed)
Until she sees the allergies she can take a lower dose such as Caltrate plus which has calcium and vitamin D and just take 1 daily and see how that works out and then we can reinstitute the dose that she was on after she sees an Proofreader

## 2012-05-13 NOTE — Telephone Encounter (Signed)
Pt informed with all the below note, pt said she will continue to take her rx for Vit. D because it has worked so good.

## 2012-05-13 NOTE — Telephone Encounter (Signed)
FYI pt started on Vit. D 50,000 unit 1 po weekly 05/04/12 she broke out into hives on 05/08/12 Saturday night and went to [facility] and saw Dr.Victoria Rankin and she believes that it maybe due her Vit. D rx. Pt still has hives outbreak and was given medication to help with this and also given a appointment to see a allergist on June 23. She was told to stop taking vit. D until OV with allergist. Pt wanted to keep you informed, she said Vit. D is working great for her and is not willing to stop unless you think she should? Until OV with allergist. Please advise

## 2012-06-22 ENCOUNTER — Other Ambulatory Visit: Payer: Self-pay | Admitting: Gynecology

## 2012-06-22 ENCOUNTER — Other Ambulatory Visit: Payer: Self-pay

## 2012-06-22 MED ORDER — FLUOXETINE HCL 10 MG PO CAPS: 10.0000 mg | ORAL_CAPSULE | Freq: Every day | ORAL | Status: DC

## 2012-06-22 NOTE — Telephone Encounter (Signed)
LAST SEEN 04-30-12.

## 2012-06-22 NOTE — Telephone Encounter (Signed)
I FORGOT EARLIER TODAY THAT JF IS NOT IN OFFICE SO I SENT ONE REFILL IN FOR PT TO HAVE RX. PT. NOTIFIED TO SET UP AEX AND SHE WILL CALL BACK TO SET UP APPT.

## 2012-07-22 ENCOUNTER — Ambulatory Visit (INDEPENDENT_AMBULATORY_CARE_PROVIDER_SITE_OTHER): Payer: BC Managed Care – PPO | Admitting: Gynecology

## 2012-07-22 ENCOUNTER — Encounter: Payer: Self-pay | Admitting: Gynecology

## 2012-07-22 VITALS — BP 120/80 | Ht 61.0 in | Wt 163.0 lb

## 2012-07-22 DIAGNOSIS — N951 Menopausal and female climacteric states: Secondary | ICD-10-CM

## 2012-07-22 DIAGNOSIS — IMO0001 Reserved for inherently not codable concepts without codable children: Secondary | ICD-10-CM

## 2012-07-22 DIAGNOSIS — E559 Vitamin D deficiency, unspecified: Secondary | ICD-10-CM | POA: Insufficient documentation

## 2012-07-22 DIAGNOSIS — N8110 Cystocele, unspecified: Secondary | ICD-10-CM | POA: Insufficient documentation

## 2012-07-22 DIAGNOSIS — F419 Anxiety disorder, unspecified: Secondary | ICD-10-CM | POA: Insufficient documentation

## 2012-07-22 DIAGNOSIS — N8111 Cystocele, midline: Secondary | ICD-10-CM

## 2012-07-22 DIAGNOSIS — R252 Cramp and spasm: Secondary | ICD-10-CM

## 2012-07-22 DIAGNOSIS — Z01419 Encounter for gynecological examination (general) (routine) without abnormal findings: Secondary | ICD-10-CM

## 2012-07-22 DIAGNOSIS — N393 Stress incontinence (female) (male): Secondary | ICD-10-CM | POA: Insufficient documentation

## 2012-07-22 DIAGNOSIS — N898 Other specified noninflammatory disorders of vagina: Secondary | ICD-10-CM

## 2012-07-22 DIAGNOSIS — R635 Abnormal weight gain: Secondary | ICD-10-CM

## 2012-07-22 DIAGNOSIS — R350 Frequency of micturition: Secondary | ICD-10-CM

## 2012-07-22 LAB — URINALYSIS W MICROSCOPIC + REFLEX CULTURE
Bilirubin Urine: NEGATIVE
Glucose, UA: NEGATIVE mg/dL
Hgb urine dipstick: NEGATIVE
Ketones, ur: NEGATIVE mg/dL
Leukocytes, UA: NEGATIVE
Nitrite: NEGATIVE
Protein, ur: NEGATIVE mg/dL
Specific Gravity, Urine: <1.005 — ABNORMAL LOW (ref 1.005–1.030)
Urobilinogen, UA: 0.2 mg/dL (ref 0.0–1.0)
pH: 7 (ref 5.0–8.0)

## 2012-07-22 LAB — WET PREP FOR TRICH, YEAST, CLUE
Clue Cells Wet Prep HPF POC: NONE SEEN
Trich, Wet Prep: NONE SEEN
WBC, Wet Prep HPF POC: NONE SEEN
Yeast Wet Prep HPF POC: NONE SEEN

## 2012-07-22 NOTE — Progress Notes (Signed)
Paige Villarreal 19-Dec-1966 [id number]   History:    46 y.o.  for annual gyn exam who presented to the office today with a multitude of complaints. She complains of frequency occasional vasomotor symptoms and her cycle sometime has been lighter some months more than others. She has also been complaining of stress urinary incontinence. She states she wears a pad all the time and felt like she may have a discharge as well. She also states sometimes in the morning she wakes up with leg cramps and for this reason she started taking potassium supplement on her own. She was seen in the office in the month of May of this year and all her labs were drawn and it was noted that her total cholesterol was elevated at 234, her triglycerides were elevated at 155 and her LDL was elevated at 148. She was placed on a cholesterol-lowering diet and exercise plan and was to be checked with a fasting lipid profile in 6 months. Her comprehensive metabolic panel was normal as was her CBC and TSH and urinalysis. Last year because of symptoms of frequency (overactive bladder) she had been placed on Enablex (anticholinergic agent) which she stated that it helped her with her frequency but still had stress uterine incontinence. She discontinued medication on her own. Review of her record indicated she was weighing 158 is up to 163 and has a BMI of 30.80. Her last mammogram was over 5 years ago. Patient's had a previous tubal sterilization procedure.  Past medical history,surgical history, family history and social history were all reviewed and documented in the EPIC chart.  Gynecologic History Patient's last menstrual period was 07/12/2012. Contraception: tubal ligation Last Pap: 2012 Results were: normal Last mammogram: Greater than 5 years ago. Results were: normal  Obstetric History OB History    Grav Para Term Preterm Abortions TAB SAB Ect Mult Living   4 2 2       2      # Outc Date GA Lbr Len/2nd Wgt Sex Del Anes PTL Lv   1 TRM      F SVD  No Yes   2 TRM     M SVD  No Yes   3 GRA            4 GRA                ROS: A ROS was performed and pertinent positives and negatives are included in the history.  GENERAL: No fevers or chills. HEENT: No change in vision, no earache, sore throat or sinus congestion. NECK: No pain or stiffness. CARDIOVASCULAR: No chest pain or pressure. No palpitations. PULMONARY: No shortness of breath, cough or wheeze. GASTROINTESTINAL: No abdominal pain, nausea, vomiting or diarrhea, melena or bright red blood per rectum. GENITOURINARY: No urinary frequency, urgency, hesitancy or dysuria. MUSCULOSKELETAL: No joint or muscle pain, no back pain, no recent trauma. DERMATOLOGIC: No rash, no itching, no lesions. ENDOCRINE: No polyuria, polydipsia, no heat or cold intolerance. No recent change in weight. HEMATOLOGICAL: No anemia or easy bruising or bleeding. NEUROLOGIC: No headache, seizures, numbness, tingling or weakness. PSYCHIATRIC: No depression, no loss of interest in normal activity or change in sleep pattern.     Exam: chaperone present  BP 120/80  Ht 5\' 1"  (1.549 m)  Wt 163 lb (73.936 kg)  BMI 30.80 kg/m2  LMP 07/12/2012  Body mass index is 30.80 kg/(m^2).  General appearance : Well developed well nourished female. No acute distress HEENT: Neck supple,  trachea midline, no carotid bruits, no thyroidmegaly Lungs: Clear to auscultation, no rhonchi or wheezes, or rib retractions  Heart: Regular rate and rhythm, no murmurs or gallops Breast:Examined in sitting and supine position were symmetrical in appearance, no palpable masses or tenderness,  no skin retraction, no nipple inversion, no nipple discharge, no skin discoloration, no axillary or supraclavicular lymphadenopathy Abdomen: no palpable masses or tenderness, no rebound or guarding Extremities: no edema or skin discoloration or tenderness  Pelvic:  Bartholin, Urethra, Skene Glands: Within normal limits             Vagina: No  gross lesions or discharge, first degree cystocele  Cervix: No gross lesions or discharge  Uterus  anteverted normal size shape and consistency, normal size, shape and consistency, non-tender and mobile  Adnexa  Without masses or tenderness  Anus and perineum  normal   Rectovaginal  normal sphincter tone without palpated masses or tenderness             Hemoccult not done   Ultrasound May 2013 2 small fibroids the largest one measured 17 x 12 mm ovaries are otherwise normal uterus is 10 week size.  Q-tip angle tests less than 30. Patient did not leak urine and the supine or the right position.    Assessment/Plan:  46 y.o. female for annual exam patient will stop by the lab today so that we can check her FSH to see if her symptoms may be attributed to her being in perimenopause. I would like to check her potassium level today because she is taking potassium on her own without any medical guidance. We will schedule for her to have a urodynamic evaluation within the next couple weeks to further assess for possible mixed incontinence. New Pap smear screening guidelines discussed. Patient would know prior history of abnormal Pap smears. Patient will not need a Pap smear for 2 more years. Requisition was provided for to schedule her mammogram. She was encouraged to be followed up with an internist to the fact that time she says she feels lightheaded as well as the issues with her like cramps. She stated she wanted to wait to see if her [facility] and her potassium were normal before she make that appointment. Patient was reminded of the importance of regular exercise program as well as monthly self breast examination. She was reminded to adhere to the cholesterol-lowering diet protocol it was provided and she is to return to the office in 6 months for fasting lipid profile. It was noted in the labs from May that her vitamin D level was slightly low at 25 and she was started on vitamin D 3 50,000 units q. weekly  for 3 months and was to return in for followup vitamin D level. She took the medication for one month and says she cannot tolerated and she is now taking calcium and vitamin D daily supplement.    Ok Edwards MD, 5:07 PM 07/22/2012

## 2012-07-22 NOTE — Patient Instructions (Signed)
Breast Self-Examination You should begin examining your breasts at age 44 even though the risk for breast cancer is low in this age group. It is important to become familiar with how your breasts look and feel. This is true for pregnant women, nursing mothers, women in menopause and women who have breast implants.  Women should examine their breasts once a month to look for changes and lumps. By doing monthly breast exams, you get to know how your breasts feel and how they can change from month to month. This allows you to pick up changes early. It can also offer you some reassurance that your breast health is good. This exam only takes minutes. Most breast lumps are not caused by cancer. If you find a lump, a special x-ray called a mammogram, or other tests may be needed to determine what is wrong.  Some of the signs that a breast lump is caused by cancer include:  Dimpling of the skin or changes in the shape of the breast or nipple.   A dark-colored or bloody discharge from the nipple.   Swollen lymph glands around the breast or in the armpit.   Redness of the breast or nipple.   Scaly nipple or skin on the breast.   Pain or swelling of the breast.  SELF-EXAM There are a few points to follow when doing a thorough breast exam. The best time to examine your breasts is 5 to 7 days after the menstrual period is over. During menstruation, the breasts are lumpier, and it may be more difficult to pick up changes. If you do not menstruate, have reached menopause or had a hysterectomy, examine your breasts the first day of every month. After three to four months, you will become more familiar with the variations of your breasts and more comfortable with the exam.  Perform your breast exam monthly. Keep a written record with breast changes or normal findings for each breast. This makes it easier to be sure of changes and to not solely depend on memory for size, tenderness, or location. Try to do the exam  at the same time each month, and write down where you are in your menstrual cycle if you are still menstruating.   Look at your breasts. Stand in front of a mirror with your hands clasped behind your head. Tighten your chest muscles and look for asymmetry. This means a difference in shape or contour from one breast to the other, such as puckers, dips or bumps. Look also for skin changes.   Lean forward with your hands on your hips. Again, look for symmetry and skin changes.   While showering, soap the breasts, and carefully feel the breasts with fingertips while holding the arm (on the side of the breast being examined) over the head. Do this with each breast carefully feeling for lumps or changes. Typically, a circular motion with moderate fingertip pressure should be used.   Repeat this exam while lying on your back, again with your arm behind your head and a pillow under your shoulders. Again, use your fingertips to examine both breasts, feeling for lumps and thickening. Begin at 1 o'clock and go clockwise around the whole breast.   At the end of your exam, gently squeeze each nipple to see if there is any drainage. Look for nipple changes, dimpling or redness.   Lastly, examine the upper chest and clavicle areas and in your armpits.  It is not necessary to become alarmed if you find   a breast lump. Most of them are not cancerous. However, it is necessary to see your caregiver if a lump is found in order to have it looked at. Document Released: 01/22/2005 Document Revised: 08/27/2011 Document Reviewed: 04/03/2009 [facility] Patient Information 2012 [place], [place].   Health Maintenance, Females A healthy lifestyle and preventative care can promote health and wellness.  Maintain regular health, dental, and eye exams.   Eat a healthy diet. Foods like vegetables, fruits, whole grains, low-fat dairy products, and lean protein foods contain the nutrients you need without too many calories. Decrease  your intake of foods high in solid fats, added sugars, and salt. Get information about a proper diet from your caregiver, if necessary.   Regular physical exercise is one of the most important things you can do for your health. Most adults should get at least 150 minutes of moderate-intensity exercise (any activity that increases your heart rate and causes you to sweat) each week. In addition, most adults need muscle-strengthening exercises on 2 or more days a week.    Maintain a healthy weight. The body mass index (BMI) is a screening tool to identify possible weight problems. It provides an estimate of body fat based on height and weight. Your caregiver can help determine your BMI, and can help you achieve or maintain a healthy weight. For adults 20 years and older:   A BMI below 18.5 is considered underweight.   A BMI of 18.5 to 24.9 is normal.   A BMI of 25 to 29.9 is considered overweight.   A BMI of 30 and above is considered obese.   Maintain normal blood lipids and cholesterol by exercising and minimizing your intake of saturated fat. Eat a balanced diet with plenty of fruits and vegetables. Blood tests for lipids and cholesterol should begin at age 45 and be repeated every 5 years. If your lipid or cholesterol levels are high, you are over 50, or you are a high risk for heart disease, you may need your cholesterol levels checked more frequently.Ongoing high lipid and cholesterol levels should be treated with medicines if diet and exercise are not effective.   If you smoke, find out from your caregiver how to quit. If you do not use tobacco, do not start.   If you are pregnant, do not drink alcohol. If you are breastfeeding, be very cautious about drinking alcohol. If you are not pregnant and choose to drink alcohol, do not exceed 1 drink per day. One drink is considered to be 12 ounces (355 mL) of beer, 5 ounces (148 mL) of wine, or 1.5 ounces (44 mL) of liquor.   Avoid use of street  drugs. Do not share needles with anyone. Ask for help if you need support or instructions about stopping the use of drugs.   High blood pressure causes heart disease and increases the risk of stroke. Blood pressure should be checked at least every 1 to 2 years. Ongoing high blood pressure should be treated with medicines, if weight loss and exercise are not effective.   If you are 49 to 44 years old, ask your caregiver if you should take aspirin to prevent strokes.   Diabetes screening involves taking a blood sample to check your fasting blood sugar level. This should be done once every 3 years, after age 27, if you are within normal weight and without risk factors for diabetes. Testing should be considered at a younger age or be carried out more frequently if you are overweight  and have at least 1 risk factor for diabetes.   Breast cancer screening is essential preventative care for women. You should practice "breast self-awareness." This means understanding the normal appearance and feel of your breasts and may include breast self-examination. Any changes detected, no matter how small, should be reported to a caregiver. Women in their 10s and 30s should have a clinical breast exam (CBE) by a caregiver as part of a regular health exam every 1 to 3 years. After age 51, women should have a CBE every year. Starting at age 22, women should consider having a mammogram (breast X-ray) every year. Women who have a family history of breast cancer should talk to their caregiver about genetic screening. Women at a high risk of breast cancer should talk to their caregiver about having an MRI and a mammogram every year.   The Pap test is a screening test for cervical cancer. Women should have a Pap test starting at age 64. Between ages 38 and 44, Pap tests should be repeated every 2 years. Beginning at age 36, you should have a Pap test every 3 years as long as the past 3 Pap tests have been normal. If you had a  hysterectomy for a problem that was not cancer or a condition that could lead to cancer, then you no longer need Pap tests. If you are between ages 36 and 57, and you have had normal Pap tests going back 10 years, you no longer need Pap tests. If you have had past treatment for cervical cancer or a condition that could lead to cancer, you need Pap tests and screening for cancer for at least 20 years after your treatment. If Pap tests have been discontinued, risk factors (such as a new sexual partner) need to be reassessed to determine if screening should be resumed. Some women have medical problems that increase the chance of getting cervical cancer. In these cases, your caregiver may recommend more frequent screening and Pap tests.   The human papillomavirus (HPV) test is an additional test that may be used for cervical cancer screening. The HPV test looks for the virus that can cause the cell changes on the cervix. The cells collected during the Pap test can be tested for HPV. The HPV test could be used to screen women aged 44 years and older, and should be used in women of any age who have unclear Pap test results. After the age of 42, women should have HPV testing at the same frequency as a Pap test.   Colorectal cancer can be detected and often prevented. Most routine colorectal cancer screening begins at the age of 36 and continues through age 54. However, your caregiver may recommend screening at an earlier age if you have risk factors for colon cancer. On a yearly basis, your caregiver may provide home test kits to check for hidden blood in the stool. Use of a small camera at the end of a tube, to directly examine the colon (sigmoidoscopy or colonoscopy), can detect the earliest forms of colorectal cancer. Talk to your caregiver about this at age 68, when routine screening begins. Direct examination of the colon should be repeated every 5 to 10 years through age 53, unless early forms of pre-cancerous  polyps or small growths are found.   Hepatitis C blood testing is recommended for all people born from [birth year] through 1963 and any individual with known risks for hepatitis C.   Practice safe sex. Use condoms and avoid  high-risk sexual practices to reduce the spread of sexually transmitted infections (STIs). Sexually active women aged 69 and younger should be checked for Chlamydia, which is a common sexually transmitted infection. Older women with new or multiple partners should also be tested for Chlamydia. Testing for other STIs is recommended if you are sexually active and at increased risk.   Osteoporosis is a disease in which the bones lose minerals and strength with aging. This can result in serious bone fractures. The risk of osteoporosis can be identified using a bone density scan. Women ages 17 and over and women at risk for fractures or osteoporosis should discuss screening with their caregivers. Ask your caregiver whether you should be taking a calcium supplement or vitamin D to reduce the rate of osteoporosis.   Menopause can be associated with physical symptoms and risks. Hormone replacement therapy is available to decrease symptoms and risks. You should talk to your caregiver about whether hormone replacement therapy is right for you.   Use sunscreen with a sun protection factor (SPF) of 30 or greater. Apply sunscreen liberally and repeatedly throughout the day. You should seek shade when your shadow is shorter than you. Protect yourself by wearing long sleeves, pants, a wide-brimmed hat, and sunglasses year round, whenever you are outdoors.   Notify your caregiver of new moles or changes in moles, especially if there is a change in shape or color. Also notify your caregiver if a mole is larger than the size of a pencil eraser.   Stay current with your immunizations.  Document Released: 06/30/2011 Document Revised: 12/04/2011 Document Reviewed: 06/30/2011 [facility] Patient Information  2012 [place], [place].   Urinary Incontinence Your doctor wants you to have this information about urinary incontinence. This is the inability to keep urine in your body until you decide to release it. CAUSES  Prostate gland enlargement is a common cause of urinary incontinence. But there are many different causes for losing urinary control. They include:  Medicines.   Infections.   Prostate problems.   Surgery.   Neurological diseases.   Emotional factors.  DIAGNOSIS  Evaluating the cause of incontinence is important in choosing the best treatment. This may require:  An ultrasound exam.   Kidney and bladder X-rays.   Cystoscopy. This is an exam of the bladder using a narrow scope.  TREATMENT  For incontinent patients, normal daily hygiene and using changing pads or adult diapers regularly will prevent offensive odors and skin damage from the moisture. Changing your medicines may help control incontinence. Your caregiver may prescribe some medicines to help you regain control. Avoid caffeine. It can over-stimulate the bladder. Use the bathroom regularly. Try about every 2 to 3 hours even if you do not feel the need. Take time to empty your bladder completely. After urinating, wait a minute. Then try to urinate again. External devices used to catch urine or an indwelling urine catheter (Foley catheter) may be needed as well. Some prostate gland problems require surgery to correct. Call your caregiver for more information. Document Released: 01/22/2005 Document Revised: 12/04/2011 Document Reviewed: 01/17/2009 [facility] Patient Information 2012 [place], [place].  Urodynamic Testing If you have a problem with urine leakage, urodynamic testing may be useful. This non-invasive test will be helpful to find out the cause of the problem. Problems in the urinary system can be caused by aging, illness, or injury. The muscles in your ureters, bladder, and urethra can become weaker with age. You  may have more urinary infections and bladder stones  because of the weakened bladder muscles. The muscles may not empty your bladder completely. Also, weakening of the urethral sphincter and the muscles of the pelvic floor can cause stress urinary continence. This is because the sphincter cannot remain tight enough to hold urine in the bladder or does not have enough support from the pelvic muscles. Urodynamics is the study of how the body stores and releases urine. These tests help your caregiver see how well your bladder and sphincter muscles work. The tests can help explain symptoms such as:  Inability to control your urine (incontinence).   Sudden, strong urges to urinate.   Painful urination.   Recurrent urinary tract infections.   Frequent urination.   Problems starting a urine stream.   Problems emptying your bladder completely.  PREPARATION FOR TEST If your caregiver recommends bladder testing, usually no special preparations are needed. Make sure you understand any instructions you receive. Depending on the test, you may be asked to come with a full bladder or an empty one. Also, ask whether you should change your diet or skip your regular medicines and for how long. TAKING THE TEST Any procedure designed to provide information about a bladder problem can be called a urodynamic test. Your caregiver will want to know whether:  You have difficulty starting a urine stream.   How hard you have to strain to maintain it.   The stream is interrupted.   Any urine is left in your bladder when you are done (post void residual).  Urodynamic tests can range from simple observation to precise measurement using instruments. TESTING METHODS The type of test you take depends on your problem. The different tests are described below.  The use of imaging equipment. This equipment films urination.   Urinating behind a curtain while a doctor or nurse listens.   If leakage is the problem, a pad  test is a simple way to measure how much urine seeps out. You will be given a number of absorbent pads and plastic bags. You will be told to wear the pad for 1 or 2 hours and then seal it in a bag. Your caregiver will then weigh the bags to see how much urine has been caught in the pad. A simpler, but less precise method is to change pads as often as you need to and keep track of how many pads you use in a day.   A physical exam will also be performed to rule out other causes of urinary problems. Other causes could include weakening pelvic muscles (pelvic prolapse) or prostate enlargement.   Pressure Flow Study-you will empty your bladder so a catheter can measure the pressures required to urinate. This study helps to identify causes of bladder outlet obstruction that men may experience with prostate enlargement. Bladder outlet obstruction is less common in women but can occur with a fallen bladder or rarely after a surgical procedure for urinary incontinence.   Electromyography (Measurement of Nerve Impulses)-If your caregiver thinks that your urinary problem is related to nerve damage, you may be given an electromyography. This test measures the muscle activity in the urethral sphincter. It uses sensors placed on the skin near the urethra and rectum. Muscle activity is recorded on a machine. The impulse patterns show if the messages sent to the bladder and urethra are coordinated correctly.   Video Urodynamics-can be performed with or without equipment to take pictures of the bladder during filling and emptying. The imaging equipment may use X-rays or sound waves.  If X-ray equipment is used, the liquid used to fill the bladder may be a contrast medium that will show up on the X-ray. The pictures and videos show the size and shape of the urinary tract and help your caregiver understand your problem.  AFTER THE TEST You may have mild discomfort for a few hours after these tests. Drinking two, 8-ounce  glasses of water, each hour for 2 hours should help. Ask your caregiver if you can take a warm bath. If not, you may be able to hold a warm, damp washcloth over the urethral opening. This may relieve discomfort. You may be given an antibiotic to prevent an infection. Call your caregiver if you have signs of infection. These signs include pain, chills, or fever. OBTAINING THE TEST RESULTS Results for simple tests can be discussed with your caregiver immediately after the test. Results of other tests may take a few days. You will have time to ask questions about the results and possible treatments for your problem. It is your responsibility to obtain your test results. Ask the lab or department performing the test when and how you will get your results. FOR MORE INFORMATION  American Urological Association: www.auanet.org [employer]: www.afud.org Interstitial Cystitis Association of America: https://www.blake-white.com/ National Kidney and Urologic Diseases Information Clearinghouse: nkudic@info .StageSync.si Document Released: 10/12/2007 Document Revised: 12/04/2011 Document Reviewed: 10/20/2007 [facility] Patient Information 2012 [place], [place].

## 2012-07-23 LAB — POTASSIUM: Potassium: 3.8 mEq/L (ref 3.5–5.3)

## 2012-07-23 LAB — FOLLICLE STIMULATING HORMONE: FSH: 3.1 m[IU]/mL

## 2012-07-24 LAB — URINE CULTURE: Colony Count: >=100000

## 2012-07-26 ENCOUNTER — Telehealth: Payer: Self-pay | Admitting: *Deleted

## 2012-07-26 MED ORDER — NITROFURANTOIN MONOHYD MACRO 100 MG PO CAPS: 100.0000 mg | ORAL_CAPSULE | Freq: Two times a day (BID) | ORAL | Status: AC

## 2012-07-26 NOTE — Telephone Encounter (Signed)
Pt informed with the below note, rx sent. 

## 2012-07-26 NOTE — Telephone Encounter (Signed)
Message copied by Aura Camps on Mon Jul 26, 2012  8:59 AM ------      Message from: Ok Edwards      Created: Mon Jul 26, 2012  8:28 AM       Paige Villarreal, please contact us patient and let her no that her urinalysis demonstrated she has a urinary tract infection. Please call in Macrobid one by mouth twice a day for 7 days #[phone]. Thank you

## 2012-08-20 ENCOUNTER — Other Ambulatory Visit: Payer: Self-pay | Admitting: Gynecology

## 2012-09-22 ENCOUNTER — Encounter: Payer: Self-pay | Admitting: Gynecology

## 2012-09-22 ENCOUNTER — Ambulatory Visit (INDEPENDENT_AMBULATORY_CARE_PROVIDER_SITE_OTHER): Payer: BC Managed Care – PPO | Admitting: Gynecology

## 2012-09-22 ENCOUNTER — Ambulatory Visit (INDEPENDENT_AMBULATORY_CARE_PROVIDER_SITE_OTHER): Payer: BC Managed Care – PPO

## 2012-09-22 VITALS — BP 126/78

## 2012-09-22 DIAGNOSIS — R109 Unspecified abdominal pain: Secondary | ICD-10-CM

## 2012-09-22 DIAGNOSIS — R141 Gas pain: Secondary | ICD-10-CM

## 2012-09-22 DIAGNOSIS — R140 Abdominal distension (gaseous): Secondary | ICD-10-CM

## 2012-09-22 DIAGNOSIS — R143 Flatulence: Secondary | ICD-10-CM

## 2012-09-22 DIAGNOSIS — K5900 Constipation, unspecified: Secondary | ICD-10-CM

## 2012-09-22 DIAGNOSIS — N915 Oligomenorrhea, unspecified: Secondary | ICD-10-CM

## 2012-09-22 LAB — URINALYSIS W MICROSCOPIC + REFLEX CULTURE
Bilirubin Urine: NEGATIVE
Casts: NONE SEEN
Crystals: NONE SEEN
Glucose, UA: NEGATIVE mg/dL
Ketones, ur: NEGATIVE mg/dL
Leukocytes, UA: NEGATIVE
Nitrite: NEGATIVE
Protein, ur: NEGATIVE mg/dL
Specific Gravity, Urine: <1.005 — ABNORMAL LOW (ref 1.005–1.030)
Urobilinogen, UA: 0.2 mg/dL (ref 0.0–1.0)
pH: 6.5 (ref 5.0–8.0)

## 2012-09-22 MED ORDER — LINACLOTIDE 290 MCG PO CAPS: 290.0000 mg | ORAL_CAPSULE | Freq: Every day | ORAL | Status: DC

## 2012-09-22 MED ORDER — MEDROXYPROGESTERONE ACETATE 10 MG PO TABS: ORAL_TABLET | ORAL | Status: DC

## 2012-09-22 NOTE — Progress Notes (Signed)
Patient presented to the office today with several complaints. She was complaining of suprapubic pressure this week on and off. She states that she's been suffered from constipation where she has only 2 bowel movements per week. She denies any frequency, dysuria some slight lower back discomfort but no fever chills occasional nausea but no vomiting. She states her last menstrual period was mid August. Review of her record indicated that a few months ago her FSH, TSH and blood sugar were normal. She denied any vaginal discharge.  Exam: Abdomen soft but bloatedness noted. Good bowel sounds all 4 quadrants. Pelvic: Bartholin urethra Skene was within normal limits Vagina: No lesion discharge thank you Cervix: No lesions or discharge Uterus: 8-10 weeks size but difficult to assess the ovaries due to patient's bloatedness. Adnexa: See above Rectal: Not done  Urinalysis 3-6 and WBC, 0-2 RBC few bacteria urine submitted for culture  Ultrasound: Uterus measures 9.3 x 5.7 x 4.5 cm endometrial stripe 11 mm (last menstrual period 08/12/2012) 2 fibers were noted the largest one measuring 17 x 10 mm. Left ovary normal right ovarian echo-free thinwall vascular cyst measuring 19 x 17 mm. No free fluid was seen.  Assessment/plan: #1 oligomenorrhea recent TSH and FSH normal we'll check prolactin level today. She'll be given a prescription of Provera 10 mg to take 1 tablet daily for 5-10 days to initiate cycle. (Patient with prior history of tubal sterilization procedure) #2 chronic idiopathic constipation/IBS. Patient to take a bottle of magnesium citrate today and then starting tomorrow she will start on Linzess 1 tablet every morning 30 minutes before breakfast to help regulate. She was encouraged to increase her fiber and fluid intake. She was reminded to followup with her gastroenterologist Dr.Hung who has seen her before.

## 2012-09-22 NOTE — Patient Instructions (Addendum)
Irritable Bowel Syndrome Irritable Bowel Syndrome (IBS) is caused by a disturbance of normal bowel function. Other terms used are spastic colon, mucous colitis, and irritable colon. It does not require surgery, nor does it lead to cancer. There is no cure for IBS. But with proper diet, stress reduction, and medication, you will find that your problems (symptoms) will gradually disappear or improve. IBS is a common digestive disorder. It usually appears in late adolescence or early adulthood. Women develop it twice as often as men. CAUSES  After food has been digested and absorbed in the small intestine, waste material is moved into the colon (large intestine). In the colon, water and salts are absorbed from the undigested products coming from the small intestine. The remaining residue, or fecal material, is held for elimination. Under normal circumstances, gentle, rhythmic contractions on the bowel walls push the fecal material along the colon towards the rectum. In IBS, however, these contractions are irregular and poorly coordinated. The fecal material is either retained too long, resulting in constipation, or expelled too soon, producing diarrhea. SYMPTOMS  The most common symptom of IBS is pain. It is typically in the lower left side of the belly (abdomen). But it may occur anywhere in the abdomen. It can be felt as heartburn, backache, or even as a dull pain in the arms or shoulders. The pain comes from excessive bowel-muscle spasms and from the buildup of gas and fecal material in the colon. This pain:  Can range from sharp belly (abdominal) cramps to a dull, continuous ache.   Usually worsens soon after eating.   Is typically relieved by having a bowel movement or passing gas.  Abdominal pain is usually accompanied by constipation. But it may also produce diarrhea. The diarrhea typically occurs right after a meal or upon arising in the morning. The stools are typically soft and watery. They are  often flecked with secretions (mucus). Other symptoms of IBS include:  Bloating.   Loss of appetite.   Heartburn.   Feeling sick to your stomach (nausea).   Belching   Vomiting   Gas.  IBS may also cause a number of symptoms that are unrelated to the digestive system:  Fatigue.   Headaches.   Anxiety   Shortness of breath   Difficulty in concentrating.   Dizziness.  These symptoms tend to come and go. DIAGNOSIS  The symptoms of IBS closely mimic the symptoms of other, more serious digestive disorders. So your caregiver may wish to perform a variety of additional tests to exclude these disorders. He/she wants to be certain of learning what is wrong (diagnosis). The nature and purpose of each test will be explained to you. TREATMENT A number of medications are available to help correct bowel function and/or relieve bowel spasms and abdominal pain. Among the drugs available are:  Mild, non-irritating laxatives for severe constipation and to help restore normal bowel habits.   Specific anti-diarrheal medications to treat severe or prolonged diarrhea.   Anti-spasmodic agents to relieve intestinal cramps.   Your caregiver may also decide to treat you with a mild tranquilizer or sedative during unusually stressful periods in your life.  The important thing to remember is that if any drug is prescribed for you, make sure that you take it exactly as directed. Make sure that your caregiver knows how well it worked for you. HOME CARE INSTRUCTIONS   Avoid foods that are high in fat or oils. Some examples are:heavy cream, butter, frankfurters, sausage, and other fatty   meats.   Avoid foods that have a laxative effect, such as fruit, fruit juice, and dairy products.   Cut out carbonated drinks, chewing gum, and "gassy" foods, such as beans and cabbage. This may help relieve bloating and belching.   Bran taken with plenty of liquids may help relieve constipation.   Keep track of  what foods seem to trigger your symptoms.   Avoid emotionally charged situations or circumstances that produce anxiety.   Start or continue exercising.   Get plenty of rest and sleep.  MAKE SURE YOU:   Understand these instructions.   Will watch your condition.   Will get help right away if you are not doing well or get worse.  Document Released: 12/15/2005 Document Revised: 12/04/2011 Document Reviewed: 08/04/2008 [facility] Patient Information 2012 [place], [place].  Constipation in Adults Constipation is having fewer than 2 bowel movements per week. Usually, the stools are hard. As we grow older, constipation is more common. If you try to fix constipation with laxatives, the problem may get worse. This is because laxatives taken over a long period of time make the colon muscles weaker. A low-fiber diet, not taking in enough fluids, and taking some medicines may make these problems worse. MEDICATIONS THAT MAY CAUSE CONSTIPATION  Water pills (diuretics).   Calcium channel blockers (used to control blood pressure and for the heart).   Certain pain medicines (narcotics).   Anticholinergics.   Anti-inflammatory agents.   Antacids that contain aluminum.  DISEASES THAT CONTRIBUTE TO CONSTIPATION  Diabetes.   Parkinson's disease.   Dementia.   Stroke.   Depression.   Illnesses that cause problems with salt and water metabolism.  HOME CARE INSTRUCTIONS   Constipation is usually best cared for without medicines. Increasing dietary fiber and eating more fruits and vegetables is the best way to manage constipation.   Slowly increase fiber intake to 25 to 38 grams per day. Whole grains, fruits, vegetables, and legumes are good sources of fiber. A dietitian can further help you incorporate high-fiber foods into your diet.   Drink enough water and fluids to keep your urine clear or pale yellow.   A fiber supplement may be added to your diet if you cannot get enough fiber from  foods.   Increasing your activities also helps improve regularity.   Suppositories, as suggested by your caregiver, will also help. If you are using antacids, such as aluminum or calcium containing products, it will be helpful to switch to products containing magnesium if your caregiver says it is okay.   If you have been given a liquid injection (enema) today, this is only a temporary measure. It should not be relied on for treatment of longstanding (chronic) constipation.   Stronger measures, such as magnesium sulfate, should be avoided if possible. This may cause uncontrollable diarrhea. Using magnesium sulfate may not allow you time to make it to the bathroom.  SEEK IMMEDIATE MEDICAL CARE IF:   There is bright red blood in the stool.   The constipation stays for more than 4 days.   There is belly (abdominal) or rectal pain.   You do not seem to be getting better.   You have any questions or concerns.  MAKE SURE YOU:   Understand these instructions.   Will watch your condition.   Will get help right away if you are not doing well or get worse.  Document Released: 09/12/2004 Document Revised: 12/04/2011 Document Reviewed: 11/18/2011 [facility] Patient Information 2012 [place], [place].  Linzess Warning   Do not give to a child younger than 54 years of age. What is this drug used for?   It is used to treat irritable bowel syndrome.   It is used to treat hard stools (constipation). What do I need to tell my doctor BEFORE I take this drug?   If you have an allergy to linaclotide or any other part of this drug.   If you are allergic to any drugs like this one, any other drugs, foods, or other substances. Tell your doctor about the allergy and what signs you had, like rash; hives; itching; shortness of breath; wheezing; cough; swelling of face, lips, tongue, or throat; or any other signs.  If you  have a bowel block.   This is not a list of all drugs or health problems that interact with this drug.   Tell your doctor and pharmacist about all of your drugs (prescription or OTC, natural products, vitamins) and health problems. You must check to make sure that it is safe for you to take this drug with all of your drugs and health problems. Do not start, stop, or change the dose of any drug without checking with your doctor. What are some things I need to know or do while I take this drug?   Tell dentists, surgeons, and other doctors that you use this drug.   Tell your doctor if you are pregnant or plan on getting pregnant. You will need to talk about the benefits and risks of using this drug while you are pregnant.   Tell your doctor if you are breast-feeding. You will need to talk about any risks to your baby.   If you think there has been an overdose, call your poison control center or get medical care right away. Be ready to tell or show what was taken, how much, and when it happened. What are some side effects that I need to call my doctor about right away?   WARNING/CAUTION: Even though it may be rare, some people may have very bad and sometimes deadly side effects when taking a drug. Tell your doctor or get medical help right away if you have any of the following signs or symptoms that may be related to a very bad side effect:  Signs of an allergic reaction, like rash; hives; itching; red, swollen, blistered, or peeling skin with or without fever; wheezing; tightness in the chest or throat; trouble breathing or talking; unusual hoarseness; or swelling of the mouth, face, lips, tongue, or throat.   Black, tarry, or bloody stools.   Very loose stools (diarrhea).  Swelling of belly.  Bloating.   Very bad belly pain. What are some other side effects of this drug?   All drugs may cause side effects. However, many people have no side effects or only have minor side effects. Call your doctor or  get medical help if any of these side effects or any other side effects bother you or do not go away:   Loose stools (diarrhea).   Gas.   Belly pain.  These are not all of the side effects that may occur. If you have questions about side effects, call your doctor. Call your doctor for medical advice about side effects.  [id number] You may report side effects to your national health agency. How is this drug best taken?   Use this drug as ordered by your doctor. Read and follow the dosing on the label closely.  Take on an  empty stomach.   Take with a full glass of water at least 30 minutes before the first food, drink, or drugs of the day.   Swallow whole. Do not chew, break, or crush. What do I do if I miss a dose?   Take a missed dose as soon as you think about it.  If it is close to the time for your next dose, skip the missed dose and go back to your normal time.   Do not take 2 doses at the same time or extra doses. How do I store and/or throw out this drug?   Store at room temperature.   Store in the original container. Do not take out the antimoisture cube.   Store in a dry place. Do not store in a bathroom.   Keep all drugs out of the reach of children and pets.   Check with your pharmacist about how to throw out unused drugs.  General drug facts   If your symptoms or health problems do not get better or if they become worse, call your doctor.   Do not share your drugs with others and do not take anyone else's drugs.   Keep a list of all your drugs (prescription, natural products, vitamins, OTC) with you. Give this list to your doctor.  Talk with the doctor before starting any new drug, including prescription or OTC, natural products, or vitamins.   Some drugs may have another patient information leaflet. If you have any questions about this drug, please talk with your doctor, pharmacist, or other health care provider.

## 2012-09-27 ENCOUNTER — Other Ambulatory Visit: Payer: Self-pay | Admitting: Gynecology

## 2012-09-27 DIAGNOSIS — N390 Urinary tract infection, site not specified: Secondary | ICD-10-CM

## 2012-09-27 LAB — URINE CULTURE: Colony Count: >=100000

## 2012-09-27 MED ORDER — SULFAMETHOXAZOLE-TRIMETHOPRIM 800-160 MG PO TABS: 1.0000 | ORAL_TABLET | Freq: Two times a day (BID) | ORAL | Status: DC

## 2013-09-05 ENCOUNTER — Encounter: Payer: Self-pay | Admitting: Women's Health

## 2013-09-05 ENCOUNTER — Ambulatory Visit (INDEPENDENT_AMBULATORY_CARE_PROVIDER_SITE_OTHER): Payer: Managed Care, Other (non HMO) | Admitting: Women's Health

## 2013-09-05 DIAGNOSIS — F329 Major depressive disorder, single episode, unspecified: Secondary | ICD-10-CM

## 2013-09-05 MED ORDER — FLUOXETINE HCL 20 MG PO CAPS: 20.0000 mg | ORAL_CAPSULE | Freq: Every day | ORAL | Status: DC

## 2013-09-05 NOTE — Progress Notes (Signed)
Patient ID: Paige Villarreal, female   DOB: [birth year], 47 y.o.   MRN: [id number] Presents with request for refill of Prozac. Going through divorce, no infidelity or abuse. Currently on Prozac 10 mg and questions if she needs a higher dose. Has gone back to work and has had some increased anxiety. Monthly cycle.  Exam: Good eye contact, able to articulate feelings concerning divorce and impending changes.  Situational stressors/anxiety/depression  Plan: Options reviewed, Prozac 20 daily, prescription, proper use, reviewed. Strongly encouraged counseling, Consuelo Pandy name and number given instructed to schedule. Schedule annual exam.

## 2013-09-15 ENCOUNTER — Telehealth: Payer: Self-pay | Admitting: *Deleted

## 2013-09-15 NOTE — Telephone Encounter (Signed)
Pt called stating she would like to start back on Prozac 10 mg she never took the 20 mg, she doesn't believe now this high dose is needed. Okay to fill 10 mg Prozac?

## 2013-09-15 NOTE — Telephone Encounter (Signed)
OK to refill prozac 10 mg daily, call if no relief of symptoms, review very low dose of prozac, exercise is also helpful for mood and stress reduction.

## 2013-09-16 MED ORDER — FLUOXETINE HCL 10 MG PO TABS: 10.0000 mg | ORAL_TABLET | Freq: Every day | ORAL | Status: DC

## 2013-09-16 NOTE — Telephone Encounter (Signed)
rx sent, pt informed, I informed pharmacy to cancel the Prozac 20 mg.

## 2013-10-27 ENCOUNTER — Encounter: Payer: Managed Care, Other (non HMO) | Admitting: Women's Health

## 2013-11-16 ENCOUNTER — Ambulatory Visit: Payer: Medicaid Other

## 2013-11-16 ENCOUNTER — Ambulatory Visit (INDEPENDENT_AMBULATORY_CARE_PROVIDER_SITE_OTHER): Payer: Managed Care, Other (non HMO) | Admitting: Family Medicine

## 2013-11-16 VITALS — BP 130/82 | HR 70 | Temp 98.9°F | Resp 18 | Wt 154.0 lb

## 2013-11-16 DIAGNOSIS — M25531 Pain in right wrist: Secondary | ICD-10-CM

## 2013-11-16 DIAGNOSIS — M79609 Pain in unspecified limb: Secondary | ICD-10-CM

## 2013-11-16 DIAGNOSIS — M25539 Pain in unspecified wrist: Secondary | ICD-10-CM

## 2013-11-16 DIAGNOSIS — M79641 Pain in right hand: Secondary | ICD-10-CM

## 2013-11-16 NOTE — Patient Instructions (Addendum)
Wear your splint as needed, especially wear it at night.  Use the ibuprofen and / or tylenol as needed.  Let me know how you are doing in a few days; if we are not seeing improvement please let me know

## 2013-11-16 NOTE — Progress Notes (Signed)
[facility] [address], [place] [address] [phone]  Date:  11/16/2013   Name:  Paige Villarreal   DOB:  1966/10/29   MRN:  [id number]  PCP:  No primary provider on file.    Chief Complaint: Hand Pain and Wrist Pain   History of Present Illness:  Paige Villarreal is a 47 y.o. very pleasant female patient who presents with the following:  About one month ago she was unloading her car and hit the back of her right hand on part of the trunk. She noted immediate pinpoint pain in her hand. She had an x-ray elsewhere; it was negative. "they told me that I had inflammation." This was over a month ago She continues to have pain in the hand which will "shoot up my arm," she notes that the hand seems to be swollen and that it it hard to extend her wrist; she cannot fully extend the wrist.  Her hand and wrist feel worse if she uses them a lot; by the end of the day they can be quite painful.  She has tried an OTC wrist splint for the last 2 days but has not noted much difference yet.  Also admits that she has been taking ibuprofen, aleve and tylenol prn.    Otherwise she is well today  LMP is current.  Former smoker.    Patient Active Problem List   Diagnosis Date Noted  . Constipation 09/22/2012  . Oligomenorrhea 09/22/2012  . Leg cramps 07/22/2012  . SUI (stress urinary incontinence), female 07/22/2012  . Vitamin d deficiency 07/22/2012  . Anxiety 07/22/2012  . Female cystocele 07/22/2012  . Perimenopause 07/22/2012  . History of gestational diabetes 04/30/2012    Past Medical History  Diagnosis Date  . Diabetes mellitus     GESTATIONAL DIABETES  . OAB (overactive bladder)   . Anxiety   . Duodenal ulcer     AGE 63  . Urinary incontinence     Past Surgical History  Procedure Laterality Date  . Tubal ligation      History  Substance Use Topics  . Smoking status: Current Every Day Smoker    Last Attempt to Quit: 04/21/2005  . Smokeless tobacco: Never Used   . Alcohol Use: No    Family History  Problem Relation Age of Onset  . Hypertension Father   . Heart disease Father   . Leukemia Maternal Grandmother     No Known Allergies  Medication list has been reviewed and updated.  Current Outpatient Prescriptions on File Prior to Visit  Medication Sig Dispense Refill  . FLUoxetine (PROZAC) 10 MG tablet Take 1 tablet (10 mg total) by mouth daily.  30 tablet  3  . Multiple Minerals-Vitamins (CALCIUM & VIT D3 BONE HEALTH PO) Take by mouth.      . Multiple Vitamins-Calcium (ONE-A-DAY WOMENS FORMULA) TABS Take by mouth.      . Potassium 75 MG TABS Take by mouth.       No current facility-administered medications on file prior to visit.    Review of Systems:  As per HPI- otherwise negative.   Physical Examination: Filed Vitals:   11/16/13 1203  BP: 130/82  Pulse: 70  Temp: 98.9 F (37.2 C)  Resp: 18   Filed Vitals:   11/16/13 1203  Weight: 154 lb (69.854 kg)   Body mass index is 29.11 kg/(m^2). Ideal Body Weight:    GEN: WDWN, NAD, Non-toxic, A & O x  3, overweight, looks well HEENT: Atraumatic, Normocephalic. Neck supple. No masses, No LAD. Ears and Nose: No external deformity. CV: RRR, No M/G/R. No JVD. No thrill. No extra heart sounds. PULM: CTA B, no wheezes, crackles, rhonchi. No retractions. No resp. distress. No accessory muscle use. EXTR: No c/c/e NEURO Normal gait.  PSYCH: Normally interactive. Conversant. Not depressed or anxious appearing.  Calm demeanor.  Right hand and wrist: she is tender along the dorsum of the right hand and wrist.  She is not able to fully extend the wrist.  Flexion is normal.  Elbow normal.  She may have minimal puffiness over the forearm but this is questionable.  No bruise, no heat.  No sign of NV damage, full cap refill and sensation  [facility] reading (PRIMARY) by  Dr. Patsy Lager. Right hand: negative Right wrist: negative  RIGHT HAND - COMPLETE 3+ VIEW  COMPARISON:  None  FINDINGS: Osseous mineralization normal.  Joint spaces preserved.  No acute fracture, dislocation or bone destruction.  Small ossific densities are seen adjacent to the tip of the distal phalanx of the right thumb, could be the result of remote trauma.  These do not have the appearance of an acute injury.  Fingers superimposed on lateral view limiting assessment.  IMPRESSION: Question sequela of a remote tuft fracture of the distal phalanx right thumb.  Otherwise negative exam.   RIGHT WRIST - COMPLETE 3+ VIEW  COMPARISON: None  FINDINGS: Osseous mineralization normal.  Joint spaces preserved.  No fracture, dislocation, or bone destruction.  IMPRESSION: Normal exam.  Assessment and Plan: Hand pain, right - Plan: DG Wrist Complete Right, DG Hand Complete Right  Wrist pain, right - Plan: DG Wrist Complete Right, DG Hand Complete Right  Advised that I do not see evidence of any acute bony injury (radiology noted possible old tuft fracture thumb, but this is not related to current problem).  She is very concerned about knowing exactly which structure in her arm is injured and how it is injured.  Explained that I cannot definitely diagnose the exact nature of her soft tissue injury without an MRI, but I expect she may get better with conservative management. Applied a fiberglass splint to the volar hand and wrist, and encouraged her to wear it as much as possible for the next 5 days or so.  She will use OTC NSAIDS as necessary.   If she is not better in about 5 days and still needs the splint she will call and I will set her up with sports med.   Signed Abbe Amsterdam, MD

## 2014-01-10 ENCOUNTER — Other Ambulatory Visit: Payer: Self-pay | Admitting: Women's Health

## 2014-01-10 MED ORDER — FLUOXETINE HCL 10 MG PO TABS: 20.0000 mg | ORAL_TABLET | Freq: Every day | ORAL | Status: DC

## 2014-07-12 ENCOUNTER — Telehealth: Payer: Self-pay | Admitting: *Deleted

## 2014-07-12 MED ORDER — FLUOXETINE HCL 10 MG PO TABS: 20.0000 mg | ORAL_TABLET | Freq: Every day | ORAL | Status: DC

## 2014-07-12 NOTE — Telephone Encounter (Signed)
rx sent, annual on 08/09/14

## 2014-07-12 NOTE — Telephone Encounter (Signed)
Ok to refill 

## 2014-07-12 NOTE — Telephone Encounter (Signed)
Pt calling requesting refill Prozac 10 mg. Pt overdue for annual, now has medicaid, transferred to front desk to schedule.

## 2014-08-09 ENCOUNTER — Ambulatory Visit (INDEPENDENT_AMBULATORY_CARE_PROVIDER_SITE_OTHER): Payer: Medicaid Other | Admitting: Women's Health

## 2014-08-09 ENCOUNTER — Encounter: Payer: Self-pay | Admitting: Women's Health

## 2014-08-09 ENCOUNTER — Other Ambulatory Visit (HOSPITAL_COMMUNITY)
Admission: RE | Admit: 2014-08-09 | Discharge: 2014-08-09 | Disposition: A | Discharge status: Home / Self Care | Payer: Medicaid Other | Source: Ambulatory Visit | Attending: Gynecology | Admitting: Gynecology

## 2014-08-09 VITALS — BP 118/74 | Ht 62.0 in | Wt 158.0 lb

## 2014-08-09 DIAGNOSIS — F411 Generalized anxiety disorder: Secondary | ICD-10-CM

## 2014-08-09 DIAGNOSIS — Z124 Encounter for screening for malignant neoplasm of cervix: Secondary | ICD-10-CM

## 2014-08-09 DIAGNOSIS — Z01419 Encounter for gynecological examination (general) (routine) without abnormal findings: Secondary | ICD-10-CM | POA: Diagnosis present

## 2014-08-09 MED ORDER — FLUOXETINE HCL 10 MG PO TABS: ORAL_TABLET | ORAL | Status: DC

## 2014-08-09 NOTE — Progress Notes (Signed)
Paige Villarreal May 30, 1966 [id number]    History:    Presents for breast and pelvic exam. Regular monthly cycle/BTL/not sexually active years.Divorce final.  Normal Pap history. Overdue for mammogram. Stable on Prozac 10 mg for anxiety/depression.  Past medical history, past surgical history, family history and social history were all reviewed and documented in the EPIC chart. Working on a Masters in Press photographer, 2 children ages 13 and 61 both doing okay with divorce.  ROS:  A  12 point ROS was performed and pertinent positives and negatives are included.  Exam:  Filed Vitals:   08/09/14 1053  BP: 118/74    General appearance:  Normal Thyroid:  Symmetrical, normal in size, without palpable masses or nodularity. Respiratory  Auscultation:  Clear without wheezing or rhonchi Cardiovascular  Auscultation:  Regular rate, without rubs, murmurs or gallops  Edema/varicosities:  Not grossly evident Abdominal  Soft,nontender, without masses, guarding or rebound.  Liver/spleen:  No organomegaly noted  Hernia:  None appreciated  Skin  Inspection:  Grossly normal   Breasts: Examined lying and sitting.     Right: Without masses, retractions, discharge or axillary adenopathy.     Left: Without masses, retractions, discharge or axillary adenopathy. Gentitourinary   Inguinal/mons:  Normal without inguinal adenopathy  External genitalia:  Normal  BUS/Urethra/Skene's glands:  Normal  Vagina:  Normal  Cervix:  Normal  Uterus:   normal in size, shape and contour.  Midline and mobile  Adnexa/parametria:     Rt: Without masses or tenderness.   Lt: Without masses or tenderness.  Anus and perineum: Normal  Digital rectal exam: Normal sphincter tone without palpated masses or tenderness  Assessment/Plan:  48 y.o. DWF G2P2 for breast and pelvic exam.  Anxiety/depression stable on Prozac 10 mg Normal GYN exam/BTL  Plan: SBE's, mammogram scholarship information given and reviewed, instructed to  schedule mammogram. Increase regular exercise and decrease calories for weight loss, calcium rich diet, reports history of increased cholesterol in the past, fish oil supplement encouraged. Contraception reviewed, condoms encouraged if sexually active. Encouraged to follow up with primary care for lab work. Prozac 10 mg by mouth daily prescription, proper use given and reviewed. Has had counseling declines need at this time states is doing much better. Pap. Pap Constance Holster  2012., new screening guidelines reviewed.   Note: This dictation was prepared with Dragon/digital dictation.  Any transcriptional errors that result are unintentional. Huel Cote [facility], 11:56 AM 08/09/2014

## 2014-08-09 NOTE — Patient Instructions (Signed)

## 2014-08-10 LAB — CYTOLOGY - PAP

## 2014-10-30 ENCOUNTER — Encounter: Payer: Self-pay | Admitting: Women's Health

## 2014-12-11 ENCOUNTER — Telehealth: Payer: Self-pay | Admitting: *Deleted

## 2014-12-11 DIAGNOSIS — M6289 Other specified disorders of muscle: Secondary | ICD-10-CM

## 2014-12-11 NOTE — Telephone Encounter (Signed)
Pt informed with the below note, order placed. Pt will come on 12/12/14 @ 8:45 am

## 2014-12-11 NOTE — Telephone Encounter (Signed)
(  pt aware you are out of the office) Pt called requesting Vit. D 50.,000 units Rx states she is feeling very tired and having body aches. States this how she felt some time again and had Vit. D level checked and it was low. I explained to pt she may need to have Vit. D recheck last done in 2013. But I would relay the information to you. Please advise

## 2014-12-11 NOTE — Telephone Encounter (Signed)
Yes, best to check vit D have her schedule lab appointment only, if low will escribe rx, review vit D toxic if level high.

## 2015-10-17 ENCOUNTER — Other Ambulatory Visit: Payer: Self-pay

## 2015-10-17 DIAGNOSIS — F411 Generalized anxiety disorder: Secondary | ICD-10-CM

## 2015-10-30 ENCOUNTER — Other Ambulatory Visit: Payer: Self-pay | Admitting: *Deleted

## 2015-10-30 DIAGNOSIS — F411 Generalized anxiety disorder: Secondary | ICD-10-CM

## 2015-11-07 ENCOUNTER — Other Ambulatory Visit: Payer: Self-pay

## 2015-11-07 DIAGNOSIS — F411 Generalized anxiety disorder: Secondary | ICD-10-CM

## 2015-11-12 ENCOUNTER — Telehealth: Payer: Self-pay

## 2015-11-12 NOTE — Telephone Encounter (Signed)
Patient called because we have not called a refill in yet to her pharmacy for her Fluoxetine.  I called her and talked with her explaining that she is 3 mos overdue for CE and needs to make appt for refill. She said she has Medicaid and it will not pay for her to come here for exam she has to see PCP at [facility].  She doesn't know when she will ever be able to come here again financially.  She said she had not seed PCP yet with [facility]. I encouraged her to call and schedule appointment with her new PCP and they will need to be the one to prescribe her Fluoxetine.  I assured her we will work with her until she can get that figured out as we don't want her to be without her medication. She said she had plenty for now and had started this refill process now because she knew she would need to figure out what to do. She will call prn.

## 2017-04-02 ENCOUNTER — Ambulatory Visit (INDEPENDENT_AMBULATORY_CARE_PROVIDER_SITE_OTHER): Payer: Medicaid Other | Admitting: Otolaryngology

## 2017-04-02 DIAGNOSIS — H93293 Other abnormal auditory perceptions, bilateral: Secondary | ICD-10-CM

## 2017-04-02 DIAGNOSIS — H9209 Otalgia, unspecified ear: Secondary | ICD-10-CM

## 2017-05-13 ENCOUNTER — Encounter: Payer: Self-pay | Admitting: Gynecology

## 2018-11-12 DIAGNOSIS — F419 Anxiety disorder, unspecified: Secondary | ICD-10-CM | POA: Diagnosis not present

## 2018-11-12 DIAGNOSIS — E559 Vitamin D deficiency, unspecified: Secondary | ICD-10-CM | POA: Diagnosis not present

## 2018-11-12 DIAGNOSIS — Z0000 Encounter for general adult medical examination without abnormal findings: Secondary | ICD-10-CM | POA: Diagnosis not present

## 2018-11-12 DIAGNOSIS — E785 Hyperlipidemia, unspecified: Secondary | ICD-10-CM | POA: Diagnosis not present

## 2018-11-12 DIAGNOSIS — R350 Frequency of micturition: Secondary | ICD-10-CM | POA: Diagnosis not present

## 2018-11-12 DIAGNOSIS — R7303 Prediabetes: Secondary | ICD-10-CM | POA: Diagnosis not present

## 2018-11-12 DIAGNOSIS — Z8632 Personal history of gestational diabetes: Secondary | ICD-10-CM | POA: Diagnosis not present

## 2019-11-17 ENCOUNTER — Encounter: Payer: Self-pay | Admitting: Emergency Medicine

## 2019-11-17 ENCOUNTER — Ambulatory Visit
Admission: EM | Admit: 2019-11-17 | Discharge: 2019-11-17 | Disposition: A | Discharge status: Home / Self Care | Payer: BC Managed Care – PPO

## 2019-11-17 ENCOUNTER — Other Ambulatory Visit: Payer: Self-pay

## 2019-11-17 ENCOUNTER — Ambulatory Visit (INDEPENDENT_AMBULATORY_CARE_PROVIDER_SITE_OTHER): Payer: BC Managed Care – PPO

## 2019-11-17 DIAGNOSIS — R2242 Localized swelling, mass and lump, left lower limb: Secondary | ICD-10-CM | POA: Diagnosis not present

## 2019-11-17 DIAGNOSIS — M7989 Other specified soft tissue disorders: Secondary | ICD-10-CM | POA: Diagnosis not present

## 2019-11-17 DIAGNOSIS — M79672 Pain in left foot: Secondary | ICD-10-CM | POA: Diagnosis not present

## 2019-11-17 MED ORDER — PREDNISONE 20 MG PO TABS: 40.0000 mg | ORAL_TABLET | Freq: Every day | ORAL | 0 refills | Status: AC

## 2019-11-17 MED ORDER — IBUPROFEN 800 MG PO TABS: 800.0000 mg | ORAL_TABLET | Freq: Three times a day (TID) | ORAL | 0 refills | Status: DC

## 2019-11-17 NOTE — ED Triage Notes (Signed)
Pt presents to [facility] for assessment of 2 days of left foot pain, worse with ambulation or palpation.  Pt states job requires her to be on and off her feet, and wears steel toed shoes at work.

## 2019-11-17 NOTE — Discharge Instructions (Signed)
°  Take medications as directed  Use ACE wrap to reduce swelling  Do not stand of walk for long periods  RICE - rest, ice, compression (ace wrap), elevation  Ice foot three times a day for 15-20 minutes each time  Keep foot elevated at the level of the heart to reduce swelling

## 2019-11-17 NOTE — ED Notes (Signed)
Patient able to ambulate independently  

## 2019-11-17 NOTE — ED Provider Notes (Signed)
[facility]    CSN: [id number] Arrival date & time: 11/17/19  1652      History   Chief Complaint Chief Complaint  Patient presents with   Foot Pain    HPI Paige Villarreal is a 53 y.o. female.   Subjective:  Paige Villarreal is a 53 y.o. female who presents with right foot pain. Onset of the symptoms was 2 days ago. Precipitating event: none known. Current symptoms include: ability to bear weight, but with some pain, pain with inversion of the foot and pain with eversion of the foot. Aggravating factors: any weight bearing, rising after sitting, running, standing and walking. Symptoms have gradually worsened. Patient has had prior foot problems. Evaluation to date: none. Treatment to date: none.  The following portions of the patient's history were reviewed and updated as appropriate: allergies, current medications, past family history, past medical history, past social history, past surgical history and problem list.        Past Medical History:  Diagnosis Date   Anxiety    Duodenal ulcer    AGE 45   OAB (overactive bladder)    Urinary incontinence     Patient Active Problem List   Diagnosis Date Noted   Constipation 09/22/2012   Oligomenorrhea 09/22/2012   Leg cramps 07/22/2012   SUI (stress urinary incontinence), female 07/22/2012   Vitamin D deficiency 07/22/2012   Anxiety 07/22/2012   Female cystocele 07/22/2012   Perimenopause 07/22/2012   History of gestational diabetes 04/30/2012    Past Surgical History:  Procedure Laterality Date   TUBAL LIGATION      OB History    Gravida  4   Para  2   Term  2   Preterm      AB      Living  2     SAB      TAB      Ectopic      Multiple      Live Births  2            Home Medications    Prior to Admission medications   Medication Sig Start Date End Date Taking? Authorizing Provider  cholecalciferol (VITAMIN D3) 25 MCG (1000 UT) tablet Take 1,000 Units by mouth  daily.   Yes [provider]  vitamin B-12 (CYANOCOBALAMIN) 1000 MCG tablet Take 1,000 mcg by mouth daily.   Yes [provider]  FLUoxetine (PROZAC) 10 MG tablet Take one tablet daily 08/09/14   Huel Cote, NP  ibuprofen (ADVIL) 800 MG tablet Take 1 tablet (800 mg total) by mouth 3 (three) times daily. Take 1 tablet three times a day with food x 5 days then every 8 hours as needed 11/17/19   Enrique Sack, FNP  Multiple Vitamins-Calcium (ONE-A-DAY WOMENS FORMULA) TABS Take by mouth.    [provider]  predniSONE (DELTASONE) 20 MG tablet Take 2 tablets (40 mg total) by mouth daily for 5 days. 11/17/19 11/22/19  Enrique Sack, FNP    Family History Family History  Adopted: Yes  Family history unknown: Yes    Social History Social History   Tobacco Use   Smoking status: Former Smoker    Packs/day: 1.00    Types: Cigarettes    Quit date: 04/21/2005    Years since quitting: 14.5   Smokeless tobacco: Never Used  Substance Use Topics   Alcohol use: No   Drug use: No     Allergies   Patient has  no known allergies.   Review of Systems Review of Systems  Musculoskeletal: Positive for arthralgias.  All other systems reviewed and are negative.    Physical Exam Triage Vital Signs ED Triage Vitals  Enc Vitals Group     BP 11/17/19 1702 (!) 142/102     Pulse Rate 11/17/19 1702 84     Resp 11/17/19 1702 18     Temp 11/17/19 1702 99 F (37.2 C)     Temp Source 11/17/19 1702 Oral     SpO2 11/17/19 1702 97 %     Weight --      Height --      Head Circumference --      Peak Flow --      Pain Score 11/17/19 1703 4     Pain Loc --      Pain Edu? --      Excl. in [place]? --    No data found.  Updated Vital Signs BP (!) 142/102    Pulse 84    Temp 99 F (37.2 C) (Oral)    Resp 18    SpO2 97%   Visual Acuity Right Eye Distance:   Left Eye Distance:   Bilateral Distance:    Right Eye Near:   Left Eye Near:    Bilateral Near:      Physical Exam Vitals signs reviewed.  Constitutional:      Appearance: Normal appearance.  HENT:     Head: Normocephalic.  Neck:     Musculoskeletal: Normal range of motion and neck supple.  Cardiovascular:     Rate and Rhythm: Normal rate and regular rhythm.     Pulses:          Dorsalis pedis pulses are 2+ on the right side.  Pulmonary:     Effort: Pulmonary effort is normal.     Breath sounds: Normal breath sounds.  Musculoskeletal: Normal range of motion.     Right foot: Normal range of motion.       Feet:  Feet:     Right foot:     Skin integrity: Skin integrity normal.     Toenail Condition: Right toenails are normal.  Skin:    General: Skin is warm and dry.  Neurological:     General: No focal deficit present.     Mental Status: She is alert and oriented to person, place, and time.      UC Treatments / Results  Labs (all labs ordered are listed, but only abnormal results are displayed) Labs Reviewed - No data to display  EKG   Radiology Dg Foot Complete Left  Result Date: 11/17/2019 CLINICAL DATA:  Dorsal foot pain. EXAM: LEFT FOOT - COMPLETE 3+ VIEW COMPARISON:  None. FINDINGS: No acute fracture or dislocation is identified. No osseous erosion is seen. Small os trigonum and os tibialis externum are noted. There is mild soft tissue swelling in the dorsal forefoot. No radiopaque foreign body or soft tissue emphysema is seen. IMPRESSION: Soft tissue swelling without evidence of acute osseous abnormality. Electronically Signed   By: Logan Bores M.D.   On: 11/17/2019 17:30    Procedures Procedures (including critical care time)  Medications Ordered in UC Medications - No data to display  Initial Impression / Assessment and Plan / UC Course  I have reviewed the triage vital signs and the nursing notes.  Pertinent labs & imaging results that were available during my care of the patient were reviewed by me and considered  in my medical decision making (see  chart for details).    53 yo female with acute right foot pain x 2 days without any known precipitating event. No prior history. Tenderness and mild edema noted to the dorsal aspect of the right foot. X-ray shows the soft tissue swelling with any evidence of acute osseous abnormalities. ACE wrap applied in clinic. Natural history and expected course discussed. Neurosurgeon distributed. Home exercise plan outlined. Rest, ice, compression, and elevation (RICE) therapy. NSAIDs per medication orders. OTC analgesics as needed.  Today's evaluation has revealed no signs of a dangerous process. Discussed diagnosis with patient and/or guardian. Patient and/or guardian aware of their diagnosis, possible red flag symptoms to watch out for and need for close follow up. Patient and/or guardian understands verbal and written discharge instructions. Patient and/or guardian comfortable with plan and disposition.  Patient and/or guardian has a clear mental status at this time, good insight into illness (after discussion and teaching) and has clear judgment to make decisions regarding their care  This care was provided during an unprecedented National Emergency due to the Novel Coronavirus (COVID-19) pandemic. COVID-19 infections and transmission risks place heavy strains on healthcare resources.  As this pandemic evolves, our facility, providers, and staff strive to respond fluidly, to remain operational, and to provide care relative to available resources and information. Outcomes are unpredictable and treatments are without well-defined guidelines. Further, the impact of COVID-19 on all aspects of urgent care, including the impact to patients seeking care for reasons other than COVID-19, is unavoidable during this national emergency. At this time of the global pandemic, management of patients has significantly changed, even for non-COVID positive patients given high local and regional COVID volumes at this time  requiring high healthcare system and resource utilization. The standard of care for management of both COVID suspected and non-COVID suspected patients continues to change rapidly at the local, regional, national, and global levels. This patient was worked up and treated to the best available but ever changing evidence and resources available at this current time.   Documentation was completed with the aid of voice recognition software. Transcription may contain typographical errors.  Final Clinical Impressions(s) / UC Diagnoses   Final diagnoses:  Localized swelling of right foot     Discharge Instructions       Take medications as directed   Use ACE wrap to reduce swelling   Do not stand of walk for long periods   RICE - rest, ice, compression (ace wrap), elevation   Ice foot three times a day for 15-20 minutes each time   Keep foot elevated at the level of the heart to reduce swelling     ED Prescriptions    Medication Sig Dispense Auth. Provider   predniSONE (DELTASONE) 20 MG tablet Take 2 tablets (40 mg total) by mouth daily for 5 days. 10 tablet Enrique Sack, FNP   ibuprofen (ADVIL) 800 MG tablet Take 1 tablet (800 mg total) by mouth 3 (three) times daily. Take 1 tablet three times a day with food x 5 days then every 8 hours as needed 30 tablet Enrique Sack, FNP     PDMP not reviewed this encounter.   Enrique Sack, [place] 11/17/19 [phone]

## 2019-12-02 DIAGNOSIS — F419 Anxiety disorder, unspecified: Secondary | ICD-10-CM | POA: Diagnosis not present

## 2020-01-30 ENCOUNTER — Ambulatory Visit: Payer: BC Managed Care – PPO | Attending: Internal Medicine

## 2020-01-30 DIAGNOSIS — Z20822 Contact with and (suspected) exposure to covid-19: Secondary | ICD-10-CM

## 2020-01-31 LAB — NOVEL CORONAVIRUS, NAA: SARS-CoV-2, NAA: NOT DETECTED

## 2020-02-14 DIAGNOSIS — N390 Urinary tract infection, site not specified: Secondary | ICD-10-CM | POA: Diagnosis not present

## 2020-02-16 DIAGNOSIS — M898X7 Other specified disorders of bone, ankle and foot: Secondary | ICD-10-CM | POA: Diagnosis not present

## 2020-02-16 DIAGNOSIS — S92502A Displaced unspecified fracture of left lesser toe(s), initial encounter for closed fracture: Secondary | ICD-10-CM | POA: Diagnosis not present

## 2020-02-27 DIAGNOSIS — R350 Frequency of micturition: Secondary | ICD-10-CM | POA: Diagnosis not present

## 2020-03-02 DIAGNOSIS — S92502A Displaced unspecified fracture of left lesser toe(s), initial encounter for closed fracture: Secondary | ICD-10-CM | POA: Diagnosis not present

## 2020-03-02 DIAGNOSIS — M79675 Pain in left toe(s): Secondary | ICD-10-CM | POA: Diagnosis not present

## 2020-03-09 DIAGNOSIS — M79675 Pain in left toe(s): Secondary | ICD-10-CM | POA: Diagnosis not present

## 2020-03-09 DIAGNOSIS — S92502S Displaced unspecified fracture of left lesser toe(s), sequela: Secondary | ICD-10-CM | POA: Diagnosis not present

## 2020-03-14 ENCOUNTER — Emergency Department (HOSPITAL_COMMUNITY): Payer: BC Managed Care – PPO

## 2020-03-14 ENCOUNTER — Observation Stay (HOSPITAL_COMMUNITY)
Admission: EM | Admit: 2020-03-14 | Discharge: 2020-03-15 | Disposition: A | Discharge status: Home / Self Care | Payer: BC Managed Care – PPO | Attending: Family Medicine | Admitting: Family Medicine

## 2020-03-14 ENCOUNTER — Other Ambulatory Visit: Payer: Self-pay

## 2020-03-14 DIAGNOSIS — R509 Fever, unspecified: Secondary | ICD-10-CM

## 2020-03-14 DIAGNOSIS — I10 Essential (primary) hypertension: Secondary | ICD-10-CM | POA: Insufficient documentation

## 2020-03-14 DIAGNOSIS — L500 Allergic urticaria: Secondary | ICD-10-CM | POA: Diagnosis not present

## 2020-03-14 DIAGNOSIS — L270 Generalized skin eruption due to drugs and medicaments taken internally: Secondary | ICD-10-CM | POA: Diagnosis present

## 2020-03-14 DIAGNOSIS — Z87891 Personal history of nicotine dependence: Secondary | ICD-10-CM | POA: Insufficient documentation

## 2020-03-14 DIAGNOSIS — F419 Anxiety disorder, unspecified: Secondary | ICD-10-CM | POA: Insufficient documentation

## 2020-03-14 DIAGNOSIS — Z79899 Other long term (current) drug therapy: Secondary | ICD-10-CM | POA: Insufficient documentation

## 2020-03-14 DIAGNOSIS — R21 Rash and other nonspecific skin eruption: Secondary | ICD-10-CM

## 2020-03-14 DIAGNOSIS — E669 Obesity, unspecified: Secondary | ICD-10-CM | POA: Insufficient documentation

## 2020-03-14 DIAGNOSIS — Z20822 Contact with and (suspected) exposure to covid-19: Secondary | ICD-10-CM | POA: Diagnosis not present

## 2020-03-14 DIAGNOSIS — N3281 Overactive bladder: Secondary | ICD-10-CM | POA: Diagnosis not present

## 2020-03-14 DIAGNOSIS — T3695XA Adverse effect of unspecified systemic antibiotic, initial encounter: Secondary | ICD-10-CM | POA: Insufficient documentation

## 2020-03-14 DIAGNOSIS — L509 Urticaria, unspecified: Secondary | ICD-10-CM | POA: Diagnosis not present

## 2020-03-14 LAB — COMPREHENSIVE METABOLIC PANEL
ALT: 49 U/L — ABNORMAL HIGH (ref 0–44)
AST: 62 U/L — ABNORMAL HIGH (ref 15–41)
Albumin: 4.1 g/dL (ref 3.5–5.0)
Alkaline Phosphatase: 69 U/L (ref 38–126)
Anion gap: 14 (ref 5–15)
BUN: 7 mg/dL (ref 6–20)
CO2: 23 mmol/L (ref 22–32)
Calcium: 9 mg/dL (ref 8.9–10.3)
Chloride: 100 mmol/L (ref 98–111)
Creatinine, Ser: 0.79 mg/dL (ref 0.44–1.00)
GFR calc Af Amer: >60 mL/min (ref >60)
GFR calc non Af Amer: >60 mL/min (ref >60)
Glucose, Bld: 133 mg/dL — ABNORMAL HIGH (ref 70–99)
Potassium: 4 mmol/L (ref 3.5–5.1)
Sodium: 137 mmol/L (ref 135–145)
Total Bilirubin: 1 mg/dL (ref 0.3–1.2)
Total Protein: 6.7 g/dL (ref 6.5–8.1)

## 2020-03-14 LAB — LACTIC ACID, PLASMA
Lactic Acid, Venous: 2.4 mmol/L (ref 0.5–1.9)
Lactic Acid, Venous: 3 mmol/L (ref 0.5–1.9)

## 2020-03-14 LAB — URINALYSIS, ROUTINE W REFLEX MICROSCOPIC
Bilirubin Urine: NEGATIVE
Glucose, UA: NEGATIVE mg/dL
Hgb urine dipstick: NEGATIVE
Ketones, ur: NEGATIVE mg/dL
Nitrite: NEGATIVE
Protein, ur: NEGATIVE mg/dL
Specific Gravity, Urine: 1.021 (ref 1.005–1.030)
pH: 5 (ref 5.0–8.0)

## 2020-03-14 LAB — CBC WITH DIFFERENTIAL/PLATELET
Abs Immature Granulocytes: 0.07 10*3/uL (ref 0.00–0.07)
Basophils Absolute: 0 10*3/uL (ref 0.0–0.1)
Basophils Relative: 0 %
Eosinophils Absolute: 0 10*3/uL (ref 0.0–0.5)
Eosinophils Relative: 0 %
HCT: 43.9 % (ref 36.0–46.0)
Hemoglobin: 14.7 g/dL (ref 12.0–15.0)
Immature Granulocytes: 0 %
Lymphocytes Relative: 7 %
Lymphs Abs: 1.2 10*3/uL (ref 0.7–4.0)
MCH: 29.9 pg (ref 26.0–34.0)
MCHC: 33.5 g/dL (ref 30.0–36.0)
MCV: 89.2 fL (ref 80.0–100.0)
Monocytes Absolute: 0.8 10*3/uL (ref 0.1–1.0)
Monocytes Relative: 5 %
Neutro Abs: 14.1 10*3/uL — ABNORMAL HIGH (ref 1.7–7.7)
Neutrophils Relative %: 88 %
Platelets: 366 10*3/uL (ref 150–400)
RBC: 4.92 MIL/uL (ref 3.87–5.11)
RDW: 12.8 % (ref 11.5–15.5)
WBC: 16.2 10*3/uL — ABNORMAL HIGH (ref 4.0–10.5)
nRBC: 0 % (ref 0.0–0.2)

## 2020-03-14 LAB — POC SARS CORONAVIRUS 2 AG -  ED: SARS Coronavirus 2 Ag: NEGATIVE

## 2020-03-14 MED ORDER — SODIUM CHLORIDE 0.9% FLUSH: 3.0000 mL | Freq: Once | INTRAVENOUS | Status: DC

## 2020-03-14 MED ORDER — DIPHENHYDRAMINE HCL 50 MG/ML IJ SOLN
25.0000 mg | Freq: Once | INTRAMUSCULAR | Status: AC
  Administered 2020-03-14: 25 mg via INTRAVENOUS
  Filled 2020-03-14: qty 1

## 2020-03-14 MED ORDER — SODIUM CHLORIDE 0.9 % IV BOLUS
1000.0000 mL | Freq: Once | INTRAVENOUS | Status: AC
  Administered 2020-03-14: 22:00:00 1000 mL via INTRAVENOUS

## 2020-03-14 MED ORDER — ONDANSETRON HCL 4 MG/2ML IJ SOLN
4.0000 mg | Freq: Once | INTRAMUSCULAR | Status: AC
  Administered 2020-03-14: 4 mg via INTRAVENOUS
  Filled 2020-03-14: qty 2

## 2020-03-14 MED ORDER — ACETAMINOPHEN 500 MG PO TABS
1000.0000 mg | ORAL_TABLET | Freq: Once | ORAL | Status: AC
  Administered 2020-03-14: 1000 mg via ORAL
  Filled 2020-03-14 (×2): qty 2

## 2020-03-14 NOTE — ED Notes (Signed)
Pt sts she is too nauseas to take tylenol

## 2020-03-14 NOTE — ED Provider Notes (Signed)
[facility] Provider Note   CSN: [id number] Arrival date & time: 03/14/20  [place]     History Chief Complaint  Patient presents with  . Urticaria  . Fever    Paige Villarreal is a 54 y.o. female.  HPI Patient presents to the emergency department with fever rash and general pain.  Patient states that she has recently been on antibiotics along with an oxybutynin patch.  Patient states she remove the patch yesterday this rash started, splotchy and has become more widespread and confluent over the last 24 hours.  Patient is also complaining of swelling and neck.  The patient states that she is having a lot of pain associated with this rash.  She is also had fever that started today.  She been tachycardic in the emergency department.  The patient states that she has not had any other new medications.  The patient denies chest pain, shortness of breath, headache,blurred vision, neck pain,cough, weakness, numbness, dizziness, anorexia, edema, abdominal pain, nausea, vomiting, diarrhea,  back pain, dysuria, hematemesis, bloody stool, near syncope, or syncope.    Past Medical History:  Diagnosis Date  . Anxiety   . Duodenal ulcer    AGE 46  . OAB (overactive bladder)   . Urinary incontinence     Patient Active Problem List   Diagnosis Date Noted  . Constipation 09/22/2012  . Oligomenorrhea 09/22/2012  . Leg cramps 07/22/2012  . SUI (stress urinary incontinence), female 07/22/2012  . Vitamin D deficiency 07/22/2012  . Anxiety 07/22/2012  . Female cystocele 07/22/2012  . Perimenopause 07/22/2012  . History of gestational diabetes 04/30/2012    Past Surgical History:  Procedure Laterality Date  . TUBAL LIGATION       OB History    Gravida  4   Para  2   Term  2   Preterm      AB      Living  2     SAB      TAB      Ectopic      Multiple      Live Births  2           Family History  Adopted: Yes  Family history unknown:  Yes    Social History   Tobacco Use  . Smoking status: Former Smoker    Packs/day: 1.00    Types: Cigarettes    Quit date: 04/21/2005    Years since quitting: 14.9  . Smokeless tobacco: Never Used  Substance Use Topics  . Alcohol use: No  . Drug use: No    Home Medications Prior to Admission medications   Medication Sig Start Date End Date Taking? Authorizing Provider  cholecalciferol (VITAMIN D3) 25 MCG (1000 UT) tablet Take 11,000 Units by mouth daily.    Yes [provider]  Choline-Inositol-Methionine (LIPO) 50-50-25 MG/ML SOLN Inject 1 Syringe into the muscle once a week.   Yes [provider]  Cyanocobalamin (VITAMIN B-12 IJ) Inject 1 Syringe as directed every 30 (thirty) days.   Yes [provider]  diphenhydrAMINE (BENADRYL) 25 MG tablet Take 25 mg by mouth every 6 (six) hours as needed (hives, itching, swelling.).   Yes [provider]  FLUoxetine (PROZAC) 10 MG tablet Take one tablet daily Patient taking differently: Take 10 mg by mouth daily.  08/09/14  Yes Huel Cote, NP  human chorionic gonadotropin (PREGNYL/NOVAREL) 10000 units injection Inject 10,000 Units into the muscle once.  Yes [provider]  hydrOXYzine (ATARAX/VISTARIL) 25 MG tablet Take 25-50 mg by mouth See admin instructions. Every 6-8 hours as needed for itching.    [provider]  ibuprofen (ADVIL) 800 MG tablet Take 1 tablet (800 mg total) by mouth 3 (three) times daily. Take 1 tablet three times a day with food x 5 days then every 8 hours as needed Patient not taking: Reported on 03/14/2020 11/17/19   Enrique Sack, FNP  predniSONE (DELTASONE) 10 MG tablet Take 10 mg by mouth See admin instructions. 6 tabs day 1 and decrease daily for 6 days. 03/14/20 03/19/20  [provider]    Allergies    Patient has no known allergies.  Review of Systems   Review of Systems All other systems negative except as documented in the HPI. All  pertinent positives and negatives as reviewed in the HPI. Physical Exam Updated Vital Signs BP (!) 139/98 (BP Location: Right Arm)   Pulse (!) 118   Temp (!) 101.3 F (38.5 C) (Oral)   Resp 18   LMP 08/06/2014   SpO2 100%   Physical Exam Vitals and nursing note reviewed.  Constitutional:      General: She is not in acute distress.    Appearance: She is well-developed.  HENT:     Head: Normocephalic and atraumatic.  Eyes:     Pupils: Pupils are equal, round, and reactive to light.  Cardiovascular:     Rate and Rhythm: Normal rate and regular rhythm.     Heart sounds: Normal heart sounds. No murmur. No friction rub. No gallop.   Pulmonary:     Effort: Pulmonary effort is normal. No respiratory distress.     Breath sounds: Normal breath sounds. No wheezing.  Abdominal:     General: Bowel sounds are normal. There is no distension.     Palpations: Abdomen is soft.     Tenderness: There is no abdominal tenderness.  Musculoskeletal:     Cervical back: Normal range of motion and neck supple.  Lymphadenopathy:     Cervical: Cervical adenopathy present.     Right cervical: Superficial cervical adenopathy and posterior cervical adenopathy present.     Left cervical: Superficial cervical adenopathy and posterior cervical adenopathy present.  Skin:    General: Skin is warm and dry.     Capillary Refill: Capillary refill takes less than 2 seconds.     Findings: No erythema or rash.     Comments: Patient has a fairly confluent rash throughout the trunk and neck along with the upper extremities.  Does have some in the inguinal regions bilaterally.  Neurological:     Mental Status: She is alert and oriented to person, place, and time.     Motor: No abnormal muscle tone.     Coordination: Coordination normal.  Psychiatric:        Behavior: Behavior normal.     ED Results / Procedures / Treatments   Labs (all labs ordered are listed, but only abnormal results are displayed) Labs  Reviewed  LACTIC ACID, PLASMA - Abnormal; Notable for the following components:      Result Value   Lactic Acid, Venous 2.4 (*)    All other components within normal limits  COMPREHENSIVE METABOLIC PANEL - Abnormal; Notable for the following components:   Glucose, Bld 133 (*)    AST 62 (*)    ALT 49 (*)    All other components within normal limits  CBC WITH DIFFERENTIAL/PLATELET - Abnormal;  Notable for the following components:   WBC 16.2 (*)    Neutro Abs 14.1 (*)    All other components within normal limits  URINALYSIS, ROUTINE W REFLEX MICROSCOPIC - Abnormal; Notable for the following components:   APPearance HAZY (*)    Leukocytes,Ua TRACE (*)    Bacteria, UA RARE (*)    All other components within normal limits  LACTIC ACID, PLASMA  POC SARS CORONAVIRUS 2 AG -  ED    EKG None  Radiology No results found.  Procedures Procedures (including critical care time)  Medications Ordered in ED Medications  sodium chloride flush (NS) 0.9 % injection 3 mL (has no administration in time range)  acetaminophen (TYLENOL) tablet 1,000 mg (1,000 mg Oral Given 03/14/20 2222)  sodium chloride 0.9 % bolus 1,000 mL (0 mLs Intravenous Stopped 03/14/20 2259)  ondansetron (ZOFRAN) injection 4 mg (4 mg Intravenous Given 03/14/20 2221)  diphenhydrAMINE (BENADRYL) injection 25 mg (25 mg Intravenous Given 03/14/20 2221)    ED Course  I have reviewed the triage vital signs and the nursing notes.  Pertinent labs & imaging results that were available during my care of the patient were reviewed by me and considered in my medical decision making (see chart for details).    MDM Rules/Calculators/A&P                      The concern I have for this patient is that she is an early Stevens-Johnson's with the increasing confluency of the rash along with the regions of the neck trunk and arms.  The patient does have fever and tachycardia that is also concerning for this as well.  Patient is complaining of  swelling in the throat but no actual pain.  I feel that the patient will need admission to the hospital for further evaluation and assessment of this rash with fever.  She does not have any meningismus at this time. Final Clinical Impression(s) / ED Diagnoses Final diagnoses:  None    Rx / DC Orders ED Discharge Orders    None       Rebeca Allegra 03/14/20 2352    Isla Pence, MD 03/16/20 2008

## 2020-03-14 NOTE — ED Triage Notes (Signed)
Pt here for evaluation of rash over abdomen that started yesterday. Today rash has spread to arms and neck and leg. Rash is itchy and painful. Went to PCP who gave her a shot but now patient is febrile and fatigued. Recently tx with two rounds of abx for UTI.

## 2020-03-15 ENCOUNTER — Encounter (HOSPITAL_COMMUNITY): Payer: Self-pay

## 2020-03-15 DIAGNOSIS — R21 Rash and other nonspecific skin eruption: Secondary | ICD-10-CM

## 2020-03-15 DIAGNOSIS — L270 Generalized skin eruption due to drugs and medicaments taken internally: Secondary | ICD-10-CM

## 2020-03-15 LAB — BASIC METABOLIC PANEL WITH GFR
Anion gap: 13 (ref 5–15)
BUN: 8 mg/dL (ref 6–20)
CO2: 19 mmol/L — ABNORMAL LOW (ref 22–32)
Calcium: 8.2 mg/dL — ABNORMAL LOW (ref 8.9–10.3)
Chloride: 105 mmol/L (ref 98–111)
Creatinine, Ser: 0.77 mg/dL (ref 0.44–1.00)
GFR calc Af Amer: >60 mL/min
GFR calc non Af Amer: >60 mL/min
Glucose, Bld: 188 mg/dL — ABNORMAL HIGH (ref 70–99)
Potassium: 4 mmol/L (ref 3.5–5.1)
Sodium: 137 mmol/L (ref 135–145)

## 2020-03-15 LAB — CBC
HCT: 42.4 % (ref 36.0–46.0)
Hemoglobin: 14.1 g/dL (ref 12.0–15.0)
MCH: 29.6 pg (ref 26.0–34.0)
MCHC: 33.3 g/dL (ref 30.0–36.0)
MCV: 89.1 fL (ref 80.0–100.0)
Platelets: 349 10*3/uL (ref 150–400)
RBC: 4.76 MIL/uL (ref 3.87–5.11)
RDW: 12.9 % (ref 11.5–15.5)
WBC: 12 10*3/uL — ABNORMAL HIGH (ref 4.0–10.5)
nRBC: 0 % (ref 0.0–0.2)

## 2020-03-15 LAB — PROCALCITONIN: Procalcitonin: <0.1 ng/mL

## 2020-03-15 LAB — FIBRINOGEN: Fibrinogen: 385 mg/dL (ref 210–475)

## 2020-03-15 LAB — HIV ANTIBODY (ROUTINE TESTING W REFLEX): HIV Screen 4th Generation wRfx: NONREACTIVE

## 2020-03-15 LAB — SARS CORONAVIRUS 2 (TAT 6-24 HRS): SARS Coronavirus 2: NEGATIVE

## 2020-03-15 LAB — C-REACTIVE PROTEIN: CRP: 2.7 mg/dL — ABNORMAL HIGH (ref <1.0)

## 2020-03-15 LAB — SEDIMENTATION RATE: Sed Rate: 6 mm/hr (ref 0–22)

## 2020-03-15 MED ORDER — METHYLPREDNISOLONE SODIUM SUCC 125 MG IJ SOLR
125.0000 mg | Freq: Once | INTRAMUSCULAR | Status: AC
  Administered 2020-03-15: 125 mg via INTRAVENOUS
  Filled 2020-03-15: qty 2

## 2020-03-15 MED ORDER — FAMOTIDINE IN NACL 20-0.9 MG/50ML-% IV SOLN
20.0000 mg | Freq: Once | INTRAVENOUS | Status: AC
  Administered 2020-03-15: 20 mg via INTRAVENOUS
  Filled 2020-03-15: qty 50

## 2020-03-15 MED ORDER — DIPHENHYDRAMINE-ZINC ACETATE 2-0.1 % EX CREA
TOPICAL_CREAM | Freq: Three times a day (TID) | CUTANEOUS | Status: DC | PRN
  Filled 2020-03-15 (×2): qty 28

## 2020-03-15 MED ORDER — DIPHENHYDRAMINE HCL 50 MG/ML IJ SOLN
12.5000 mg | Freq: Four times a day (QID) | INTRAMUSCULAR | Status: DC
  Administered 2020-03-15 (×2): 12.5 mg via INTRAVENOUS
  Filled 2020-03-15 (×2): qty 1

## 2020-03-15 MED ORDER — SODIUM CHLORIDE 0.9 % IV SOLN: INTRAVENOUS | Status: DC

## 2020-03-15 MED ORDER — PREDNISONE 10 MG PO TABS: ORAL_TABLET | ORAL | 0 refills | Status: DC

## 2020-03-15 MED ORDER — ONDANSETRON HCL 4 MG/2ML IJ SOLN: 4.0000 mg | Freq: Four times a day (QID) | INTRAMUSCULAR | Status: DC | PRN

## 2020-03-15 MED ORDER — ACETAMINOPHEN 325 MG PO TABS: 650.0000 mg | ORAL_TABLET | Freq: Four times a day (QID) | ORAL | Status: DC | PRN

## 2020-03-15 MED ORDER — METHYLPREDNISOLONE SODIUM SUCC 40 MG IJ SOLR
40.0000 mg | Freq: Three times a day (TID) | INTRAMUSCULAR | Status: DC
  Administered 2020-03-15: 40 mg via INTRAVENOUS
  Filled 2020-03-15: qty 1

## 2020-03-15 MED ORDER — FAMOTIDINE IN NACL 20-0.9 MG/50ML-% IV SOLN
20.0000 mg | Freq: Two times a day (BID) | INTRAVENOUS | Status: DC
  Administered 2020-03-15 (×2): 20 mg via INTRAVENOUS
  Filled 2020-03-15 (×2): qty 50

## 2020-03-15 MED ORDER — DIPHENHYDRAMINE HCL 50 MG/ML IJ SOLN: 25.0000 mg | Freq: Once | INTRAMUSCULAR | Status: DC

## 2020-03-15 MED ORDER — DIPHENHYDRAMINE HCL 25 MG PO CAPS
25.0000 mg | ORAL_CAPSULE | Freq: Once | ORAL | Status: AC
  Administered 2020-03-15: 25 mg via ORAL
  Filled 2020-03-15: qty 1

## 2020-03-15 MED ORDER — ACETAMINOPHEN 650 MG RE SUPP: 650.0000 mg | Freq: Four times a day (QID) | RECTAL | Status: DC | PRN

## 2020-03-15 MED ORDER — FLUOXETINE HCL 10 MG PO CAPS
10.0000 mg | ORAL_CAPSULE | Freq: Every day | ORAL | Status: DC
  Administered 2020-03-15: 10 mg via ORAL
  Filled 2020-03-15: qty 1

## 2020-03-15 MED ORDER — PREDNISONE 50 MG PO TABS
50.0000 mg | ORAL_TABLET | Freq: Once | ORAL | Status: AC
  Administered 2020-03-15: 50 mg via ORAL
  Filled 2020-03-15: qty 1

## 2020-03-15 MED ORDER — HEPARIN SODIUM (PORCINE) 5000 UNIT/ML IJ SOLN
5000.0000 [IU] | Freq: Three times a day (TID) | INTRAMUSCULAR | Status: DC
  Filled 2020-03-15: qty 1

## 2020-03-15 MED ORDER — ONDANSETRON HCL 4 MG PO TABS: 4.0000 mg | ORAL_TABLET | Freq: Four times a day (QID) | ORAL | Status: DC | PRN

## 2020-03-15 NOTE — Discharge Instructions (Signed)
Call and inform the weight loss clinic regarding skin rash as it can happen after taking HCG injection.

## 2020-03-15 NOTE — ED Notes (Signed)
RN tried to give report and was unsuccessful.

## 2020-03-15 NOTE — Discharge Summary (Signed)
Physician Discharge Summary  Paige Villarreal [id number] DOB: 22-Oct-1966 DOA: 03/14/2020  PCP: [facility] [place]  Admit date: 03/14/2020 Discharge date: 03/15/2020  Time spent: 50* minutes  Recommendations for Outpatient Follow-up:  1. Follow-up PCP in 1 week 2. Recommended patient to call  the weight loss clinic and inform regarding urticaria rash.  This can happen due to hCG and other weight loss injections. 3. Nitrofurantoin is also listed as allergy  Discharge Diagnoses:  Active Problems:   Allergic drug rash   Discharge Condition: Stable  Diet recommendation: Heart healthy diet  There were no vitals filed for this visit.  History of present illness:  54 y.o. female with medical history significant of  HTN,obesity, recurrent UTI who presents with 24-48 hours of persistent progressive hives. Patient states that  Hives began on her neck and abdomen and the progressed to now involving her enter abdomen back and  Part of her lower extremity. Patient states hives are very pruritic and painful. She notes that she completed a 7 days course of unknown antibiotic 2 days prior to onset of symptoms. She note no sick contacts and states she has been home from the last three weeks due to a broken toe. She note no fever, chills or uri symptoms prior to the onset of her symptoms. She notes no new new foods, or recent travel.  She denies any associated sore throat, swelling of her tongue,or mouth sores, or shortness of breath. She notes nausea but no vomiting or diarrhea. She states she has no know allergies.  Prior to presenting to ed patient was seen in clinic and treated with solumedrol w/o significant improvement. Of note patient is also on HCG injections for treatment of obesity.   Hospital Course:   Severe urticaria-improved after patient received Solu-Medrol, Benadryl, H2 blocker.  At this time we will discharge her home on prednisone taper and as needed Benadryl.  Patient  took nitrofurantoin from March 4 to March 10.  We went to list nitrofurantoin as possible allergy.  Of note patient is also getting hCG injections every week, her last injection was on March 13, 2020.  I have explained to the patient that she could have had hypersensitivity reaction to hCG injection.  I advised her to hold these injections for now and call weight loss clinic and inform them regarding hospitalization for urticaria.  Fever-resolved, chest x-ray showed streaky opacity, concern for infectious process.  Does not appear to be bacterial infection, procalcitonin less than 0.10.  Likely due to the severe allergic reaction as above.    Procedures:  Consultations:  Discharge Exam: Vitals:   03/15/20 0200 03/15/20 0346  BP: 128/71 (!) 136/93  Pulse: 90 92  Resp:  18  Temp:  99.2 F (37.3 C)  SpO2: 94% 99%    General-appears in no acute distress Heart-S1-S2, regular, no murmur auscultated Lungs-clear to auscultation bilaterally, no wheezing or crackles auscultated Abdomen-soft, nontender, no organomegaly Extremities-no edema in the lower extremities Neuro-alert, oriented x3, no focal deficit noted  Discharge Instructions   Discharge Instructions    Diet - low sodium heart healthy   Complete by: As directed    Increase activity slowly   Complete by: As directed      Allergies as of 03/15/2020      Reactions   Nitrofuran Derivatives    Rash, itching, uricaria, wheals      Medication List    STOP taking these medications   cholecalciferol 25 MCG (1000 UNIT)  tablet Commonly known as: VITAMIN D3   ibuprofen 800 MG tablet Commonly known as: ADVIL     TAKE these medications   diphenhydrAMINE 25 MG tablet Commonly known as: BENADRYL Take 25 mg by mouth every 6 (six) hours as needed (hives, itching, swelling.).   FLUoxetine 10 MG tablet Commonly known as: PROZAC Take one tablet daily What changed:   how much to take  how to take this  when to take  this  additional instructions   human chorionic gonadotropin 10000 units injection Commonly known as: PREGNYL/NOVAREL Inject 10,000 Units into the muscle once.   hydrOXYzine 25 MG tablet Commonly known as: ATARAX/VISTARIL Take 25-50 mg by mouth See admin instructions. Every 6-8 hours as needed for itching.   Lipo 50-50-25 MG/ML Soln Inject 1 Syringe into the muscle once a week.   predniSONE 10 MG tablet Commonly known as: DELTASONE Prednisone 40 mg po daily x 1 day then Prednisone 30 mg po daily x 1 day then Prednisone 20 mg po daily x 1 day then Prednisone 10 mg daily x 1 day then stop... What changed:   how much to take  how to take this  when to take this  additional instructions   VITAMIN B-12 IJ Inject 1 Syringe as directed every 30 (thirty) days.      Allergies  Allergen Reactions  . Nitrofuran Derivatives     Rash, itching, uricaria, wheals      The results of significant diagnostics from this hospitalization (including imaging, microbiology, ancillary and laboratory) are listed below for reference.    Significant Diagnostic Studies: DG Chest 2 View  Result Date: 03/14/2020 CLINICAL DATA:  Fever EXAM: CHEST - 2 VIEW COMPARISON:  Radiograph 01/19/2009 FINDINGS: Streaky opacities in the bases favor atelectasis though early infection could have a similar appearance. No pneumothorax or effusion. No convincing features of edema. The cardiomediastinal contours are unremarkable. No acute osseous or soft tissue abnormality. Telemetry leads overlie the chest. IMPRESSION: Streaky opacities in the bases favor atelectasis though early infection could have a similar appearance. Electronically Signed   By: Lovena Le M.D.   On: 03/14/2020 23:54    Microbiology: Recent Results (from the past 240 hour(s))  SARS CORONAVIRUS 2 (TAT 6-24 HRS) Nasopharyngeal Nasopharyngeal Swab     Status: None   Collection Time: 03/15/20 12:00 AM   Specimen: Nasopharyngeal Swab  Result  Value Ref Range Status   SARS Coronavirus 2 NEGATIVE NEGATIVE Final    Comment: (NOTE) SARS-CoV-2 target nucleic acids are NOT DETECTED. The SARS-CoV-2 RNA is generally detectable in upper and lower respiratory specimens during the acute phase of infection. Negative results do not preclude SARS-CoV-2 infection, do not rule out co-infections with other pathogens, and should not be used as the sole basis for treatment or other patient management decisions. Negative results must be combined with clinical observations, patient history, and epidemiological information. The expected result is Negative. Fact Sheet for Patients: SugarRoll.be Fact Sheet for Healthcare Providers: https://www.woods-mathews.com/ This test is not yet approved or cleared by the [place] FDA and  has been authorized for detection and/or diagnosis of SARS-CoV-2 by FDA under an Emergency Use Authorization (EUA). This EUA will remain  in effect (meaning this test can be used) for the duration of the COVID-19 declaration under Section 56 4(b)(1) of the Act, 21 U.S.C. section 360bbb-3(b)(1), unless the authorization is terminated or revoked sooner. Performed at [facility], [place] [address], [place], [place] [address]  Labs: Basic Metabolic Panel: Recent Labs  Lab 03/14/20 1850 03/15/20 0428  NA 137 137  K 4.0 4.0  CL 100 105  CO2 23 19*  GLUCOSE 133* 188*  BUN 7 8  CREATININE 0.79 0.77  CALCIUM 9.0 8.2*   Liver Function Tests: Recent Labs  Lab 03/14/20 1850  AST 62*  ALT 49*  ALKPHOS 69  BILITOT 1.0  PROT 6.7  ALBUMIN 4.1   No results for input(s): LIPASE, AMYLASE in the last 168 hours. No results for input(s): AMMONIA in the last 168 hours. CBC: Recent Labs  Lab 03/14/20 1850 03/15/20 0428  WBC 16.2* 12.0*  NEUTROABS 14.1*  --   HGB 14.7 14.1  HCT 43.9 42.4  MCV 89.2 89.1  PLT 366 349       Signed:  Oswald Hillock MD.  [facility] 03/15/2020, 8:41 AM

## 2020-03-15 NOTE — ED Notes (Signed)
RN to RN report given to 6N with no questions or concerns.

## 2020-03-15 NOTE — Progress Notes (Signed)
Discharged pt to home, AVS given and explained. Belongings returned accordingly.

## 2020-03-15 NOTE — ED Notes (Signed)
COVID-19 swab obtained and walked to micro.

## 2020-03-15 NOTE — H&P (Addendum)
History and Physical    Paige Villarreal [id number] DOB: Aug 30, 1966 DOA: 03/14/2020  PCP: [facility] [place]  Patient coming from: home  I have personally briefly reviewed patient's old medical records in [place]  Chief Complaint: whole body urticaria  HPI: Paige Villarreal is a 54 y.o. female with medical history significant of  HTN,obesity, recurrent UTI who presents with 24-48 hours of persistent progressive hives. Patient states that  Hives began on her neck and abdomen and the progressed to now involving her enter abdomen back and  Part of her lower extremity. Patient states hives are very pruritic and painful. She notes that she completed a 7 days course of unknown antibiotic 2 days prior to onset of symptoms. She note no sick contacts and states she has been home from the last three weeks due to a broken toe. She note no fever, chills or uri symptoms prior to the onset of her symptoms. She notes no new new foods, or recent travel.  She denies any associated sore throat, swelling of her tongue,or mouth sores, or shortness of breath. She notes nausea but no vomiting or diarrhea. She states she has no know allergies.  Prior to presenting to ed patient was seen in clinic and treated with solumedrol w/o significant improvement. Of note patient is also on HCG injections for treatment of obesity.    ED Course:  Patient was treated with solumedrol, benadryl and zantac  Due to continued progression of urticaria with systemic signs of fever in ed and elevated wbc without obvious signs of infection patient was admitted for observation.  Review of Systems: As per HPI otherwise 10 point review of systems negative.   Past Medical History:  Diagnosis Date  . Anxiety   . Duodenal ulcer    AGE 57  . OAB (overactive bladder)   . Urinary incontinence     Past Surgical History:  Procedure Laterality Date  . TUBAL LIGATION       reports that she quit smoking about 14 years  ago. Her smoking use included cigarettes. She smoked 1.00 pack per day. She has never used smokeless tobacco. She reports that she does not drink alcohol or use drugs.  No Known Allergies  Family History  Adopted: Yes  Family history unknown: Yes    Prior to Admission medications   Medication Sig Start Date End Date Taking? Authorizing Provider  cholecalciferol (VITAMIN D3) 25 MCG (1000 UT) tablet Take 11,000 Units by mouth daily.    Yes [provider]  Choline-Inositol-Methionine (LIPO) 50-50-25 MG/ML SOLN Inject 1 Syringe into the muscle once a week.   Yes [provider]  Cyanocobalamin (VITAMIN B-12 IJ) Inject 1 Syringe as directed every 30 (thirty) days.   Yes [provider]  diphenhydrAMINE (BENADRYL) 25 MG tablet Take 25 mg by mouth every 6 (six) hours as needed (hives, itching, swelling.).   Yes [provider]  FLUoxetine (PROZAC) 10 MG tablet Take one tablet daily Patient taking differently: Take 10 mg by mouth daily.  08/09/14  Yes Huel Cote, NP  human chorionic gonadotropin (PREGNYL/NOVAREL) 10000 units injection Inject 10,000 Units into the muscle once.   Yes [provider]  hydrOXYzine (ATARAX/VISTARIL) 25 MG tablet Take 25-50 mg by mouth See admin instructions. Every 6-8 hours as needed for itching.    [provider]  ibuprofen (ADVIL) 800 MG tablet Take 1 tablet (800 mg total) by mouth 3 (three) times daily. Take 1 tablet  three times a day with food x 5 days then every 8 hours as needed Patient not taking: Reported on 03/14/2020 11/17/19   Enrique Sack, FNP  predniSONE (DELTASONE) 10 MG tablet Take 10 mg by mouth See admin instructions. 6 tabs day 1 and decrease daily for 6 days. 03/14/20 03/19/20  [provider]    Physical Exam: Vitals:   03/14/20 1844 03/14/20 2358 03/15/20 0000 03/15/20 0200  BP: (!) 139/98  120/67 128/71  Pulse: (!) 118 95 96 90  Resp: 18     Temp: (!) 101.3 F (38.5 C) 99.3  F (37.4 C)    TempSrc: Oral Oral    SpO2: 100%  94% 94%    Constitutional: NAD, calm, comfortable Vitals:   03/14/20 1844 03/14/20 2358 03/15/20 0000 03/15/20 0200  BP: (!) 139/98  120/67 128/71  Pulse: (!) 118 95 96 90  Resp: 18     Temp: (!) 101.3 F (38.5 C) 99.3 F (37.4 C)    TempSrc: Oral Oral    SpO2: 100%  94% 94%   Eyes: PERRL, lids and conjunctivae normal ENMT: Mucous membranes are moist no ulcer noted. Posterior pharynx clear of any exudate or lesions.Normal dentition.  Neck: normal, supple, no masses, no thyromegaly Respiratory: clear to auscultation bilaterally, no wheezing, no crackles. Normal respiratory effort. No accessory muscle use.  Cardiovascular: Regular rate and rhythm, no murmurs / rubs / gallops. No extremity edema. 2+ pedal pulses. No carotid bruits.  Abdomen: no tenderness, no masses palpated. No hepatosplenomegaly. Bowel sounds positive.  Musculoskeletal: no clubbing / cyanosis. No joint deformity upper and lower extremities. Good ROM, no contractures. Normal muscle tone.  Skin: diffuse hives from neck  To upper thigh, ore severe on back Mildly tender Neurologic: CN 2-12 grossly intact. Sensation intact, DTR normal. Strength 5/5 in all 4.  Psychiatric: Normal judgment and insight. Alert and oriented x 3. Normal mood.    Labs on Admission: I have personally reviewed following labs and imaging studies  CBC: Recent Labs  Lab 03/14/20 1850  WBC 16.2*  NEUTROABS 14.1*  HGB 14.7  HCT 43.9  MCV 89.2  PLT [id number]   Basic Metabolic Panel: Recent Labs  Lab 03/14/20 1850  NA 137  K 4.0  CL 100  CO2 23  GLUCOSE 133*  BUN 7  CREATININE 0.79  CALCIUM 9.0   GFR: CrCl cannot be calculated (Unknown ideal weight.). Liver Function Tests: Recent Labs  Lab 03/14/20 1850  AST 62*  ALT 49*  ALKPHOS 69  BILITOT 1.0  PROT 6.7  ALBUMIN 4.1   No results for input(s): LIPASE, AMYLASE in the last 168 hours. No results for input(s): AMMONIA in the  last 168 hours. Coagulation Profile: No results for input(s): INR, PROTIME in the last 168 hours. Cardiac Enzymes: No results for input(s): CKTOTAL, CKMB, CKMBINDEX, TROPONINI in the last 168 hours. BNP (last 3 results) No results for input(s): PROBNP in the last 8760 hours. HbA1C: No results for input(s): HGBA1C in the last 72 hours. CBG: No results for input(s): GLUCAP in the last 168 hours. Lipid Profile: No results for input(s): CHOL, HDL, LDLCALC, TRIG, CHOLHDL, LDLDIRECT in the last 72 hours. Thyroid Function Tests: No results for input(s): TSH, T4TOTAL, FREET4, T3FREE, THYROIDAB in the last 72 hours. Anemia Panel: No results for input(s): VITAMINB12, FOLATE, FERRITIN, TIBC, IRON, RETICCTPCT in the last 72 hours. Urine analysis:    Component Value Date/Time   COLORURINE YELLOW 03/14/2020 1948   APPEARANCEUR HAZY (A) 03/14/2020 1948  LABSPEC 1.021 03/14/2020 1948   PHURINE 5.0 03/14/2020 1948   GLUCOSEU NEGATIVE 03/14/2020 1948   HGBUR NEGATIVE 03/14/2020 1948   BILIRUBINUR NEGATIVE 03/14/2020 1948   KETONESUR NEGATIVE 03/14/2020 1948   PROTEINUR NEGATIVE 03/14/2020 1948   UROBILINOGEN 0.2 09/22/2012 1553   NITRITE NEGATIVE 03/14/2020 1948   LEUKOCYTESUR TRACE (A) 03/14/2020 1948    Radiological Exams on Admission: DG Chest 2 View  Result Date: 03/14/2020 CLINICAL DATA:  Fever EXAM: CHEST - 2 VIEW COMPARISON:  Radiograph 01/19/2009 FINDINGS: Streaky opacities in the bases favor atelectasis though early infection could have a similar appearance. No pneumothorax or effusion. No convincing features of edema. The cardiomediastinal contours are unremarkable. No acute osseous or soft tissue abnormality. Telemetry leads overlie the chest. IMPRESSION: Streaky opacities in the bases favor atelectasis though early infection could have a similar appearance. Electronically Signed   By: Lovena Le M.D.   On: 03/14/2020 23:54    EKG: Independently reviewed.  NA  Assessment/Plan Severe Urticaria Nos  -concern for delayed drug reaction vs possible infectious etiology vs autoimmune disease nos  - crp elevated , sed rate wnl  -solumedrol bid /bendaryl/H2 blocker ATC  if symptoms persistent can consider further autoimmune evaluation    Abn cxr findings  -doubt pna  -repeat cbc with decrease wbc  - paucity of respiratory symptoms -covid negative   HTN -stable /resume home medications  FEN Stable replete prn  ivfs overnight   DVT prophylaxis: lovenox   Code Status:  Full  Family Communication:   Discussed with daughter and patient  Disposition Plan: obsevation  Consults called: n/a/ consider allergy consult in am  Admission status: observation    Clance Boll MD [facility]  If 7PM-7AM, please contact night-coverage [email or web address] Password [facility]  03/15/2020, 2:41 AM

## 2020-03-15 NOTE — ED Notes (Signed)
Medications given and charted per MAR. Tolerated well.  

## 2020-03-16 ENCOUNTER — Emergency Department (HOSPITAL_COMMUNITY)
Admission: EM | Admit: 2020-03-16 | Discharge: 2020-03-16 | Disposition: A | Discharge status: Home / Self Care | Payer: BC Managed Care – PPO | Attending: Emergency Medicine | Admitting: Emergency Medicine

## 2020-03-16 ENCOUNTER — Other Ambulatory Visit: Payer: Self-pay

## 2020-03-16 ENCOUNTER — Encounter (HOSPITAL_COMMUNITY): Payer: Self-pay | Admitting: Emergency Medicine

## 2020-03-16 DIAGNOSIS — R220 Localized swelling, mass and lump, head: Secondary | ICD-10-CM | POA: Diagnosis not present

## 2020-03-16 DIAGNOSIS — L299 Pruritus, unspecified: Secondary | ICD-10-CM | POA: Diagnosis not present

## 2020-03-16 DIAGNOSIS — Z79899 Other long term (current) drug therapy: Secondary | ICD-10-CM | POA: Insufficient documentation

## 2020-03-16 DIAGNOSIS — Z87891 Personal history of nicotine dependence: Secondary | ICD-10-CM | POA: Diagnosis not present

## 2020-03-16 DIAGNOSIS — L509 Urticaria, unspecified: Secondary | ICD-10-CM | POA: Diagnosis not present

## 2020-03-16 DIAGNOSIS — L500 Allergic urticaria: Secondary | ICD-10-CM | POA: Diagnosis not present

## 2020-03-16 DIAGNOSIS — T7840XA Allergy, unspecified, initial encounter: Secondary | ICD-10-CM | POA: Insufficient documentation

## 2020-03-16 MED ORDER — METHYLPREDNISOLONE SODIUM SUCC 125 MG IJ SOLR
125.0000 mg | Freq: Once | INTRAMUSCULAR | Status: AC
  Administered 2020-03-16: 06:00:00 125 mg via INTRAVENOUS
  Filled 2020-03-16: qty 2

## 2020-03-16 MED ORDER — DIPHENHYDRAMINE HCL 50 MG/ML IJ SOLN
25.0000 mg | Freq: Once | INTRAMUSCULAR | Status: AC
  Administered 2020-03-16: 25 mg via INTRAVENOUS
  Filled 2020-03-16: qty 1

## 2020-03-16 MED ORDER — FAMOTIDINE 20 MG PO TABS: 20.0000 mg | ORAL_TABLET | Freq: Every day | ORAL | 0 refills | Status: DC

## 2020-03-16 MED ORDER — FAMOTIDINE IN NACL 20-0.9 MG/50ML-% IV SOLN
20.0000 mg | Freq: Once | INTRAVENOUS | Status: AC
  Administered 2020-03-16: 20 mg via INTRAVENOUS
  Filled 2020-03-16: qty 50

## 2020-03-16 NOTE — Discharge Instructions (Addendum)
Please follow-up with your PCP and an allergist for further evaluation and management of your allergic reaction.  Please take the steroids for the next few days starting tomorrow and use the antihistamines as well.  Please rest and stay hydrated.  If your symptoms worsen please return to nearest emergency department if you start having respiratory difficulty, use the EpiPen.

## 2020-03-16 NOTE — ED Triage Notes (Signed)
  Patient comes in with allergic reaction to medication.  Was seen and admitted over night for reaction and discharged earlier yesterday afternoon.  Patient is covered with raised red rash and has facial swelling.  No difficulty breathing but states it feels difficult to swallow.  States lips are numb and tingling.  SPO2 100%

## 2020-03-16 NOTE — ED Notes (Signed)
Pt verbalized understanding of discharge instructions. Follow up care and prescriptions reviewed, pt had no further questions. Ambulated independently to lobby. 

## 2020-03-16 NOTE — ED Provider Notes (Signed)
[facility] EM[facility]ENC[facility] DEPARTMENT Provider Note  CSN: [id number] Arrival date & time: 03/16/20 [id number]  Chief Complaint(s) Allergic Reaction  HPI Paige Villarreal is a 54 y.o. female discharge yesterday after being admitted for allergic reaction, who was discharged with steroids, Benadryl, Atarax returns for worsening urticaria which has spread to the lower extremities and her face.  Some of the urticaria from yesterday is subsiding however new ones are popping up.  She is extremely pruritic.  She has associated facial swelling.  She is endorsing discomfort with swallowing but no shortness of breath.  No change in phonation.  Patient reports that she has been taking the Benadryl Atarax with minimal relief.  She denies any headache, nausea, vomiting.  No other physical complaints.  HPI  Past Medical History Past Medical History:  Diagnosis Date  . Anxiety   . Duodenal ulcer    AGE 64  . OAB (overactive bladder)   . Urinary incontinence    Patient Active Problem List   Diagnosis Date Noted  . Allergic drug rash 03/15/2020  . Constipation 09/22/2012  . Oligomenorrhea 09/22/2012  . Leg cramps 07/22/2012  . SUI (stress urinary incontinence), female 07/22/2012  . Vitamin D deficiency 07/22/2012  . Anxiety 07/22/2012  . Female cystocele 07/22/2012  . Perimenopause 07/22/2012  . History of gestational diabetes 04/30/2012   Home Medication(s) Prior to Admission medications   Medication Sig Start Date End Date Taking? Authorizing Provider  Choline-Inositol-Methionine (LIPO) 50-50-25 MG/ML SOLN Inject 1 Syringe into the muscle once a week.    [provider]  Cyanocobalamin (VITAMIN B-12 IJ) Inject 1 Syringe as directed every 30 (thirty) days.    [provider]  diphenhydrAMINE (BENADRYL) 25 MG tablet Take 25 mg by mouth every 6 (six) hours as needed (hives, itching, swelling.).    [provider]  famotidine (PEPCID) 20 MG tablet Take 1 tablet (20 mg  total) by mouth daily for 5 days. 03/16/20 03/21/20  Fatima Blank, MD  FLUoxetine (PROZAC) 10 MG tablet Take one tablet daily Patient taking differently: Take 10 mg by mouth daily.  08/09/14   Huel Cote, NP  human chorionic gonadotropin (PREGNYL/NOVAREL) 10000 units injection Inject 10,000 Units into the muscle once.    [provider]  hydrOXYzine (ATARAX/VISTARIL) 25 MG tablet Take 25-50 mg by mouth See admin instructions. Every 6-8 hours as needed for itching.    [provider]  predniSONE (DELTASONE) 10 MG tablet Prednisone 40 mg po daily x 1 day then Prednisone 30 mg po daily x 1 day then Prednisone 20 mg po daily x 1 day then Prednisone 10 mg daily x 1 day then stop... 03/15/20   Oswald Hillock, MD                                                                                                                                    Past Surgical History  Past Surgical History:  Procedure Laterality Date  . TUBAL LIGATION     Family History Family History  Adopted: Yes  Family history unknown: Yes    Social History Social History   Tobacco Use  . Smoking status: Former Smoker    Packs/day: 1.00    Types: Cigarettes    Quit date: 04/21/2005    Years since quitting: 14.9  . Smokeless tobacco: Never Used  Substance Use Topics  . Alcohol use: No  . Drug use: No   Allergies Nitrofuran derivatives  Review of Systems Review of Systems All other systems are reviewed and are negative for acute change except as noted in the HPI  Physical Exam Vital Signs  I have reviewed the triage vital signs BP (!) 147/74 (BP Location: Left Arm)   Pulse (!) 115   Temp 100.2 F (37.9 C) (Oral)   Resp 20   Ht 5\' 1"  (1.549 m)   Wt 70.3 kg   LMP 08/06/2014   SpO2 100%   BMI 29.29 kg/m   Physical Exam Vitals reviewed.  Constitutional:      General: She is not in acute distress.    Appearance: She is well-developed. She is not diaphoretic.  HENT:     Head:  Normocephalic and atraumatic.     Nose: Nose normal.     Mouth/Throat:     Mouth: No angioedema.     Pharynx: No pharyngeal swelling, posterior oropharyngeal erythema or uvula swelling.  Eyes:     General: No scleral icterus.       Right eye: No discharge.        Left eye: No discharge.     Conjunctiva/sclera: Conjunctivae normal.     Pupils: Pupils are equal, round, and reactive to light.  Cardiovascular:     Rate and Rhythm: Normal rate and regular rhythm.     Heart sounds: No murmur. No friction rub. No gallop.   Pulmonary:     Effort: Pulmonary effort is normal. No respiratory distress.     Breath sounds: Normal breath sounds. No stridor. No rales.  Abdominal:     General: There is no distension.     Palpations: Abdomen is soft.     Tenderness: There is no abdominal tenderness.  Musculoskeletal:        General: No tenderness.     Cervical back: Normal range of motion and neck supple.  Skin:    General: Skin is warm and dry.     Findings: Rash present. No erythema. Rash is urticarial (to face, neck, torso, BUE, and BLE).  Neurological:     Mental Status: She is alert and oriented to person, place, and time.     ED Results and Treatments Labs (all labs ordered are listed, but only abnormal results are displayed) Labs Reviewed - No data to display  EKG  EKG Interpretation  Date/Time:    Ventricular Rate:    PR Interval:    QRS Duration:   QT Interval:    QTC Calculation:   R Axis:     Text Interpretation:        Radiology No results found.  Pertinent labs & imaging results that were available during my care of the patient were reviewed by me and considered in my medical decision making (see chart for details).  Medications Ordered in ED Medications  famotidine (PEPCID) IVPB 20 mg premix (20 mg Intravenous New Bag/Given 03/16/20 [id number])    methylPREDNISolone sodium succinate (SOLU-MEDROL) 125 mg/2 mL injection 125 mg (125 mg Intravenous Given 03/16/20 0611)  diphenhydrAMINE (BENADRYL) injection 25 mg (25 mg Intravenous Given 03/16/20 [id number])                                                                                                                                    Procedures Procedures  (including critical care time)  Medical Decision Making / ED Course I have reviewed the nursing notes for this encounter and the patient's prior records (if available in EHR or on provided paperwork).   Chee Sulek Frandsen was evaluated in Emergency Department on 03/16/2020 for the symptoms described in the history of present illness. She was evaluated in the context of the global COVID-19 pandemic, which necessitated consideration that the patient might be at risk for infection with the SARS-CoV-2 virus that causes COVID-19. Institutional protocols and algorithms that pertain to the evaluation of patients at risk for COVID-19 are in a state of rapid change based on information released by regulatory bodies including the CDC and federal and state organizations. These policies and algorithms were followed during the patient's care in the ED.  54 y.o. female here with pruritic rash. No known triggers or exposures. No respiratory, GI, or neurologic symptoms to suggest anaphylaxis..  Patient has taken benadryl at 8p.   On exam, there is no evidence of oral swelling or airway compromise.   Given Benadryl, H2 blocker, and steroids.   Will monitor for progression  Patient care turned over to Dr Sherry Ruffing. Patient case and results discussed in detail; please see their note for further ED managment.         Final Clinical Impression(s) / ED Diagnoses Final diagnoses:  Urticaria      This chart was dictated using voice recognition software.  Despite best efforts to proofread,  errors can occur which can change the documentation meaning.   Fatima Blank, MD 03/16/20 [phone]

## 2020-03-16 NOTE — ED Provider Notes (Signed)
8:13 AM Care assumed from Dr. Leonette Monarch.  At time of transfer of care, patient is awaiting further monitoring and management of likely allergic reaction.  Patient is already feeling better after getting steroids and antihistamines.  Plan of care is to monitor the patient for several hours and if she continues to feel better, discharge her to fill a prescription of steroids, EpiPen, and antihistamines.  She will follow-up with her PCP.  Anticipate discharge after reassessment.  12:24 PM Patient was observed for several hours and had continued rash.  She reports the swelling on her face and mouth has improved.  She is able to eat and drink without difficulty and has no difficulty breathing.  She has no wheezing on my reassessment.  Blood pressure has improved.  She is no longer tachycardic on my exam.  Patient will continue outpatient steroids starting tomorrow and will take antihistamines.  She will follow up with PCP and allergist for further management.  Patient does admit to me that she used to have severe allergies "everything" when she was little requiring shots.  She reports that she will avoid new medications and will stay away from the plan she was exposed to over the weekend.  She agrees with plan of care and discharge and was discharged in good condition she also filled prescription for EpiPen.  Clinical Impression: 1. Urticaria   2. Allergic reaction, initial encounter     Disposition: Discharge  Condition: Good  I have discussed the results, Dx and Tx plan with the pt(& family if present). He/she/they expressed understanding and agree(s) with the plan. Discharge instructions discussed at great length. Strict return precautions discussed and pt &/or family have verbalized understanding of the instructions. No further questions at time of discharge.    New Prescriptions   FAMOTIDINE (PEPCID) 20 MG TABLET    Take 1 tablet (20 mg total) by mouth daily for 5 days.    Follow Up: Leighton Ruff, [place] [address] [place] [address] [id number] mc [place]       Storm Sovine, Gwenyth Allegra, MD 03/16/20 [phone]

## 2020-03-19 DIAGNOSIS — Z09 Encounter for follow-up examination after completed treatment for conditions other than malignant neoplasm: Secondary | ICD-10-CM | POA: Diagnosis not present

## 2020-03-19 DIAGNOSIS — T7840XA Allergy, unspecified, initial encounter: Secondary | ICD-10-CM | POA: Diagnosis not present

## 2020-03-19 DIAGNOSIS — L509 Urticaria, unspecified: Secondary | ICD-10-CM | POA: Diagnosis not present

## 2020-03-23 DIAGNOSIS — L509 Urticaria, unspecified: Secondary | ICD-10-CM | POA: Diagnosis not present

## 2020-03-23 DIAGNOSIS — T7840XD Allergy, unspecified, subsequent encounter: Secondary | ICD-10-CM | POA: Diagnosis not present

## 2020-03-23 DIAGNOSIS — M79642 Pain in left hand: Secondary | ICD-10-CM | POA: Diagnosis not present

## 2020-03-23 DIAGNOSIS — R7303 Prediabetes: Secondary | ICD-10-CM | POA: Diagnosis not present

## 2020-03-27 ENCOUNTER — Telehealth: Payer: Self-pay | Admitting: *Deleted

## 2020-03-27 NOTE — Telephone Encounter (Signed)
Patient last seen in 2015 called requesting refill on overactive bladder medication. I explained she is overdue for annual exam and this could not be done without scheduling with provider for annual exam and discussion. Patient declined at this time.

## 2020-04-23 DIAGNOSIS — J309 Allergic rhinitis, unspecified: Secondary | ICD-10-CM | POA: Diagnosis not present

## 2020-04-23 DIAGNOSIS — J3089 Other allergic rhinitis: Secondary | ICD-10-CM | POA: Diagnosis not present

## 2020-04-23 DIAGNOSIS — L501 Idiopathic urticaria: Secondary | ICD-10-CM | POA: Diagnosis not present

## 2020-10-24 DIAGNOSIS — Z720 Tobacco use: Secondary | ICD-10-CM | POA: Diagnosis not present

## 2020-12-03 DIAGNOSIS — Z03818 Encounter for observation for suspected exposure to other biological agents ruled out: Secondary | ICD-10-CM | POA: Diagnosis not present

## 2020-12-03 DIAGNOSIS — E049 Nontoxic goiter, unspecified: Secondary | ICD-10-CM | POA: Diagnosis not present

## 2020-12-03 DIAGNOSIS — R519 Headache, unspecified: Secondary | ICD-10-CM | POA: Diagnosis not present

## 2021-03-28 DIAGNOSIS — Z03818 Encounter for observation for suspected exposure to other biological agents ruled out: Secondary | ICD-10-CM | POA: Diagnosis not present

## 2021-03-28 DIAGNOSIS — Z20822 Contact with and (suspected) exposure to covid-19: Secondary | ICD-10-CM | POA: Diagnosis not present

## 2021-04-16 DIAGNOSIS — R7303 Prediabetes: Secondary | ICD-10-CM | POA: Diagnosis not present

## 2021-04-16 DIAGNOSIS — F419 Anxiety disorder, unspecified: Secondary | ICD-10-CM | POA: Diagnosis not present

## 2021-04-16 DIAGNOSIS — E7800 Pure hypercholesterolemia, unspecified: Secondary | ICD-10-CM | POA: Diagnosis not present

## 2021-04-16 DIAGNOSIS — Z9119 Patient's noncompliance with other medical treatment and regimen: Secondary | ICD-10-CM | POA: Diagnosis not present

## 2021-05-02 ENCOUNTER — Encounter: Payer: Self-pay | Admitting: *Deleted

## 2021-05-02 ENCOUNTER — Other Ambulatory Visit: Payer: Self-pay

## 2021-05-02 ENCOUNTER — Ambulatory Visit (INDEPENDENT_AMBULATORY_CARE_PROVIDER_SITE_OTHER): Payer: BC Managed Care – PPO

## 2021-05-02 ENCOUNTER — Ambulatory Visit
Admission: EM | Admit: 2021-05-02 | Discharge: 2021-05-02 | Disposition: A | Discharge status: Home / Self Care | Payer: BC Managed Care – PPO | Attending: Family Medicine | Admitting: Family Medicine

## 2021-05-02 DIAGNOSIS — S8991XA Unspecified injury of right lower leg, initial encounter: Secondary | ICD-10-CM | POA: Diagnosis not present

## 2021-05-02 DIAGNOSIS — M25561 Pain in right knee: Secondary | ICD-10-CM

## 2021-05-02 DIAGNOSIS — M25461 Effusion, right knee: Secondary | ICD-10-CM | POA: Diagnosis not present

## 2021-05-02 MED ORDER — TIZANIDINE HCL 4 MG PO TABS: 2.0000 mg | ORAL_TABLET | Freq: Four times a day (QID) | ORAL | 0 refills | Status: DC | PRN

## 2021-05-02 MED ORDER — PREDNISONE 20 MG PO TABS: 40.0000 mg | ORAL_TABLET | Freq: Every day | ORAL | 0 refills | Status: DC

## 2021-05-02 NOTE — ED Triage Notes (Signed)
Pt reports RT leg pain started after standing up out of car today.

## 2021-05-02 NOTE — ED Provider Notes (Addendum)
[facility]    CSN: [id number] Arrival date & time: 05/02/21  1916      History   Chief Complaint Chief Complaint  Patient presents with  . Leg Pain    HPI Paige Villarreal is a 55 y.o. female.   HPI  Patient reports after stepping out of her vehicle today she immediately experienced severe pain involving her right knee. Pain and swelling involving the right knee progressively worsened. She denies any history of right knee pain.  Past Medical History:  Diagnosis Date  . Anxiety   . Duodenal ulcer    AGE 45  . OAB (overactive bladder)   . Urinary incontinence     Patient Active Problem List   Diagnosis Date Noted  . Allergic drug rash 03/15/2020  . Constipation 09/22/2012  . Oligomenorrhea 09/22/2012  . Leg cramps 07/22/2012  . SUI (stress urinary incontinence), female 07/22/2012  . Vitamin D deficiency 07/22/2012  . Anxiety 07/22/2012  . Female cystocele 07/22/2012  . Perimenopause 07/22/2012  . History of gestational diabetes 04/30/2012    Past Surgical History:  Procedure Laterality Date  . TUBAL LIGATION      OB History    Gravida  4   Para  2   Term  2   Preterm      AB      Living  2     SAB      IAB      Ectopic      Multiple      Live Births  2            Home Medications    Prior to Admission medications   Medication Sig Start Date End Date Taking? Authorizing Provider  Choline-Inositol-Methionine (LIPO) 50-50-25 MG/ML SOLN Inject 1 Syringe into the muscle once a week.    [provider]  Cyanocobalamin (VITAMIN B-12 IJ) Inject 1 Syringe as directed every 30 (thirty) days.    [provider]  diphenhydrAMINE (BENADRYL) 25 MG tablet Take 25 mg by mouth every 6 (six) hours as needed (hives, itching, swelling.).    [provider]  famotidine (PEPCID) 20 MG tablet Take 1 tablet (20 mg total) by mouth daily for 5 days. 03/16/20 03/21/20  Tegeler, Gwenyth Allegra, MD  FLUoxetine (PROZAC) 10 MG tablet  Take one tablet daily Patient taking differently: Take 10 mg by mouth daily.  08/09/14   Huel Cote, NP  human chorionic gonadotropin (PREGNYL/NOVAREL) 10000 units injection Inject 10,000 Units into the muscle once.    [provider]  hydrOXYzine (ATARAX/VISTARIL) 25 MG tablet Take 25-50 mg by mouth See admin instructions. Every 6-8 hours as needed for itching.    [provider]  predniSONE (DELTASONE) 10 MG tablet Prednisone 40 mg po daily x 1 day then Prednisone 30 mg po daily x 1 day then Prednisone 20 mg po daily x 1 day then Prednisone 10 mg daily x 1 day then stop... 03/15/20   Oswald Hillock, MD    Family History Family History  Adopted: Yes  Family history unknown: Yes    Social History Social History   Tobacco Use  . Smoking status: Former Smoker    Packs/day: 1.00    Types: Cigarettes    Quit date: 04/21/2005    Years since quitting: 16.0  . Smokeless tobacco: Never Used  Substance Use Topics  . Alcohol use: No  . Drug use: No     Allergies   Nitrofuran derivatives  Review of Systems Review of Systems Pertinent negatives listed in HPI  Physical Exam Triage Vital Signs ED Triage Vitals  Enc Vitals Group     BP 05/02/21 2029 135/86     Pulse Rate 05/02/21 2029 90     Resp 05/02/21 2029 20     Temp 05/02/21 2029 (!) 100.4 F (38 C)     Temp Source 05/02/21 2029 Oral     SpO2 05/02/21 2029 98 %     Weight --      Height --      Head Circumference --      Peak Flow --      Pain Score 05/02/21 2032 10     Pain Loc --      Pain Edu? --      Excl. in [place]? --    No data found.  Updated Vital Signs BP 135/86 (BP Location: Left Arm)   Pulse 90   Temp (!) 100.4 F (38 C) (Oral)   Resp 20   LMP 08/06/2014   SpO2 98%   Visual Acuity Right Eye Distance:   Left Eye Distance:   Bilateral Distance:    Right Eye Near:   Left Eye Near:    Bilateral Near:     Physical Exam General appearance: alert, well developed, well  nourished, cooperative Head: Normocephalic, without obvious abnormality, atraumatic Respiratory: Respirations even and unlabored, normal respiratory rate Heart: Rate and rhythm normal.  Extremities: Right knee, swelling, impair ROM, no gross deformity, no palpable effusion Skin: Skin color, texture, turgor normal. No rashes seen  Psych: Appropriate mood and affect.   UC Treatments / Results  Labs (all labs ordered are listed, but only abnormal results are displayed) Labs Reviewed - No data to display  EKG   Radiology No results found.  Procedures Procedures (including critical care time)  Medications Ordered in UC Medications - No data to display  Initial Impression / Assessment and Plan / UC Course  I have reviewed the triage vital signs and the nursing notes.  Pertinent labs & imaging results that were available during my care of the patient were reviewed by me and considered in my medical decision making (see chart for details).    Acute knee injury, imaging negative for acute fracture of misalignment suspect of ligament injury given no apparent cause of injury noted of imaging. Apply hinged knee brace, prednisone, and tizanidine for pain. Orthopedic follow-up as needed. Final Clinical Impressions(s) / UC Diagnoses   Final diagnoses:  Right knee injury, initial encounter  Acute pain of right knee   Discharge Instructions   None    ED Prescriptions    Medication Sig Dispense Auth. Provider   predniSONE (DELTASONE) 20 MG tablet Take 2 tablets (40 mg total) by mouth daily with breakfast. 10 tablet Scot Jun, FNP   tiZANidine (ZANAFLEX) 4 MG tablet Take 0.5-1 tablets (2-4 mg total) by mouth every 6 (six) hours as needed for muscle spasms. 30 tablet Scot Jun, FNP     PDMP not reviewed this encounter.   Scot Jun, FNP 05/05/21 2207    Scot Jun, FNP 05/05/21 [phone]

## 2021-05-03 DIAGNOSIS — M25461 Effusion, right knee: Secondary | ICD-10-CM | POA: Diagnosis not present

## 2021-05-03 DIAGNOSIS — M25561 Pain in right knee: Secondary | ICD-10-CM | POA: Diagnosis not present

## 2021-05-06 DIAGNOSIS — N3001 Acute cystitis with hematuria: Secondary | ICD-10-CM | POA: Diagnosis not present

## 2021-05-08 DIAGNOSIS — Z20822 Contact with and (suspected) exposure to covid-19: Secondary | ICD-10-CM | POA: Diagnosis not present

## 2021-05-13 DIAGNOSIS — N390 Urinary tract infection, site not specified: Secondary | ICD-10-CM | POA: Diagnosis not present

## 2021-05-15 DIAGNOSIS — M25561 Pain in right knee: Secondary | ICD-10-CM | POA: Diagnosis not present

## 2021-05-21 DIAGNOSIS — Z889 Allergy status to unspecified drugs, medicaments and biological substances status: Secondary | ICD-10-CM | POA: Diagnosis not present

## 2021-05-21 DIAGNOSIS — T3695XA Adverse effect of unspecified systemic antibiotic, initial encounter: Secondary | ICD-10-CM | POA: Diagnosis not present

## 2021-05-23 DIAGNOSIS — Z20822 Contact with and (suspected) exposure to covid-19: Secondary | ICD-10-CM | POA: Diagnosis not present

## 2021-07-11 DIAGNOSIS — E663 Overweight: Secondary | ICD-10-CM | POA: Diagnosis not present

## 2021-07-11 DIAGNOSIS — L299 Pruritus, unspecified: Secondary | ICD-10-CM | POA: Diagnosis not present

## 2021-07-11 DIAGNOSIS — E559 Vitamin D deficiency, unspecified: Secondary | ICD-10-CM | POA: Diagnosis not present

## 2021-07-11 DIAGNOSIS — F411 Generalized anxiety disorder: Secondary | ICD-10-CM | POA: Diagnosis not present

## 2021-07-11 DIAGNOSIS — E7800 Pure hypercholesterolemia, unspecified: Secondary | ICD-10-CM | POA: Diagnosis not present

## 2021-07-11 DIAGNOSIS — R7303 Prediabetes: Secondary | ICD-10-CM | POA: Diagnosis not present

## 2021-07-11 DIAGNOSIS — R350 Frequency of micturition: Secondary | ICD-10-CM | POA: Diagnosis not present

## 2021-07-20 IMAGING — DX DG KNEE 1-2V*R*
2 series · 2 of 2 positions shown · non-contrast
Comparison: None

CLINICAL DATA: RIGHT knee in, pain after standing and getting out
of car, constant pain increased with weight-bearing and bending
knee, greater pain medially and posteriorly, pain radiating up to
hip and down to foot

EXAM:
RIGHT KNEE - 1-2 VIEW

[knee standing ap]
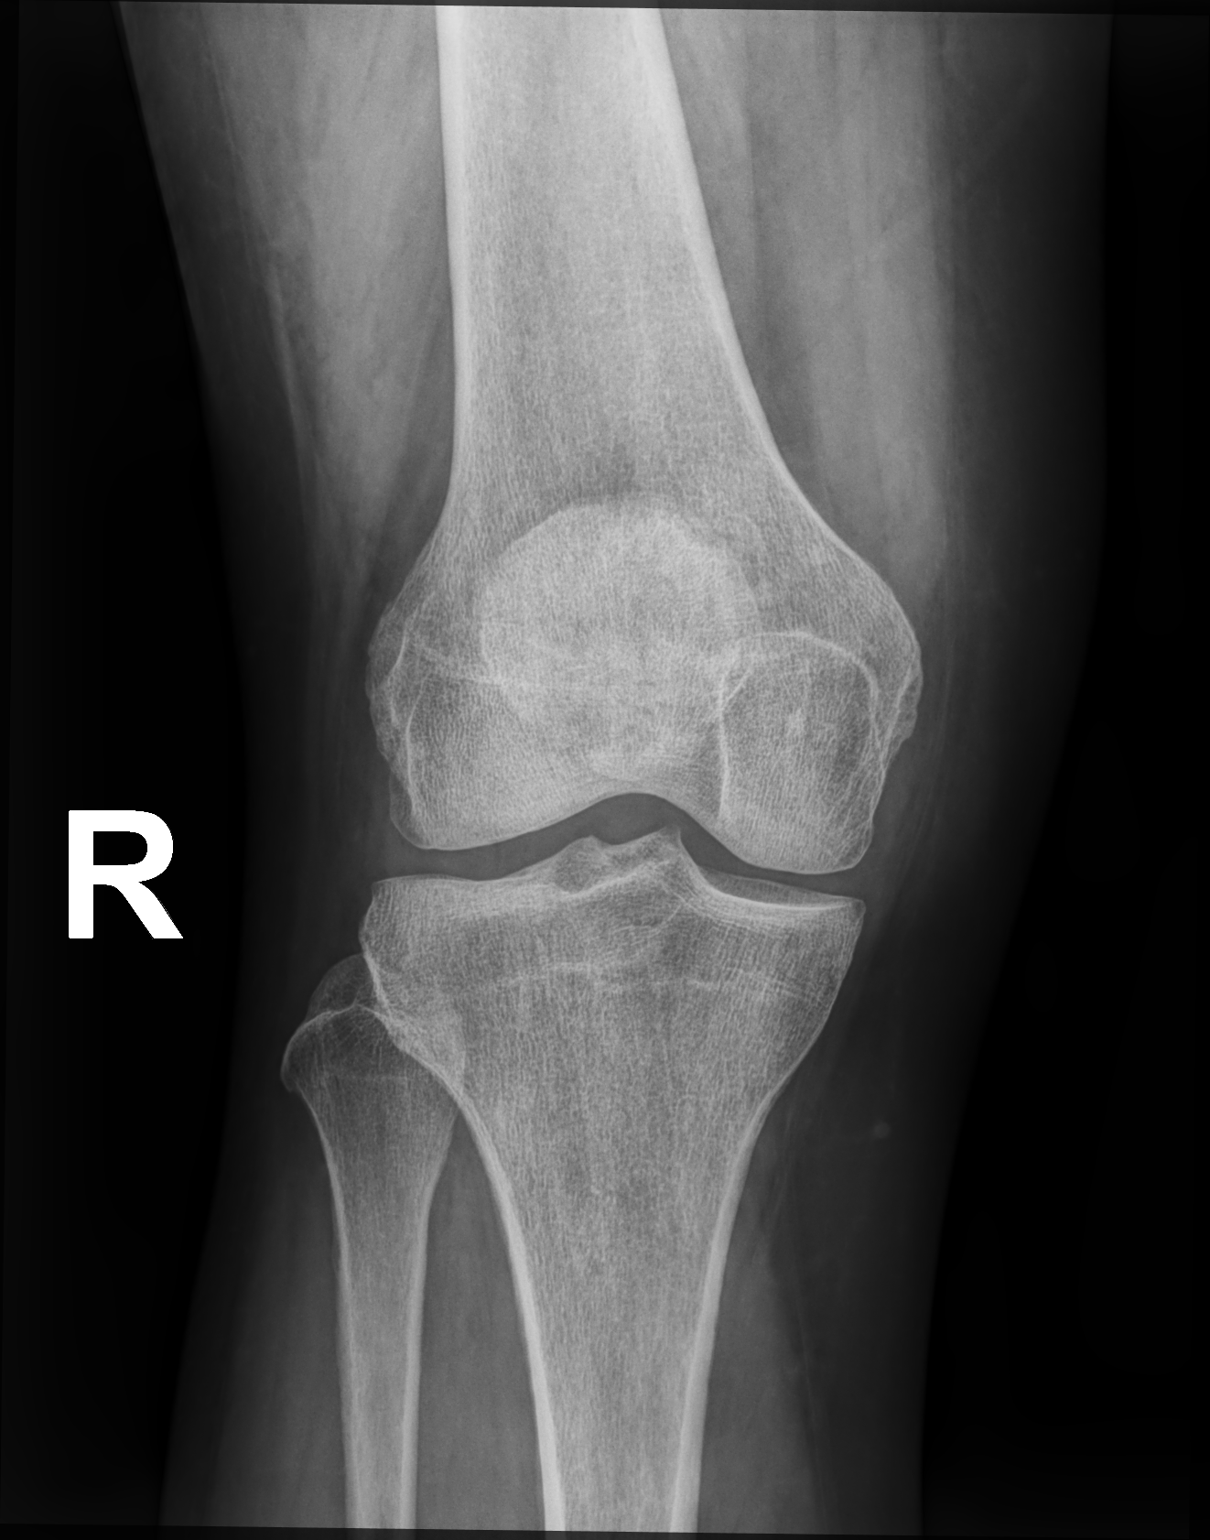

[knee lat]
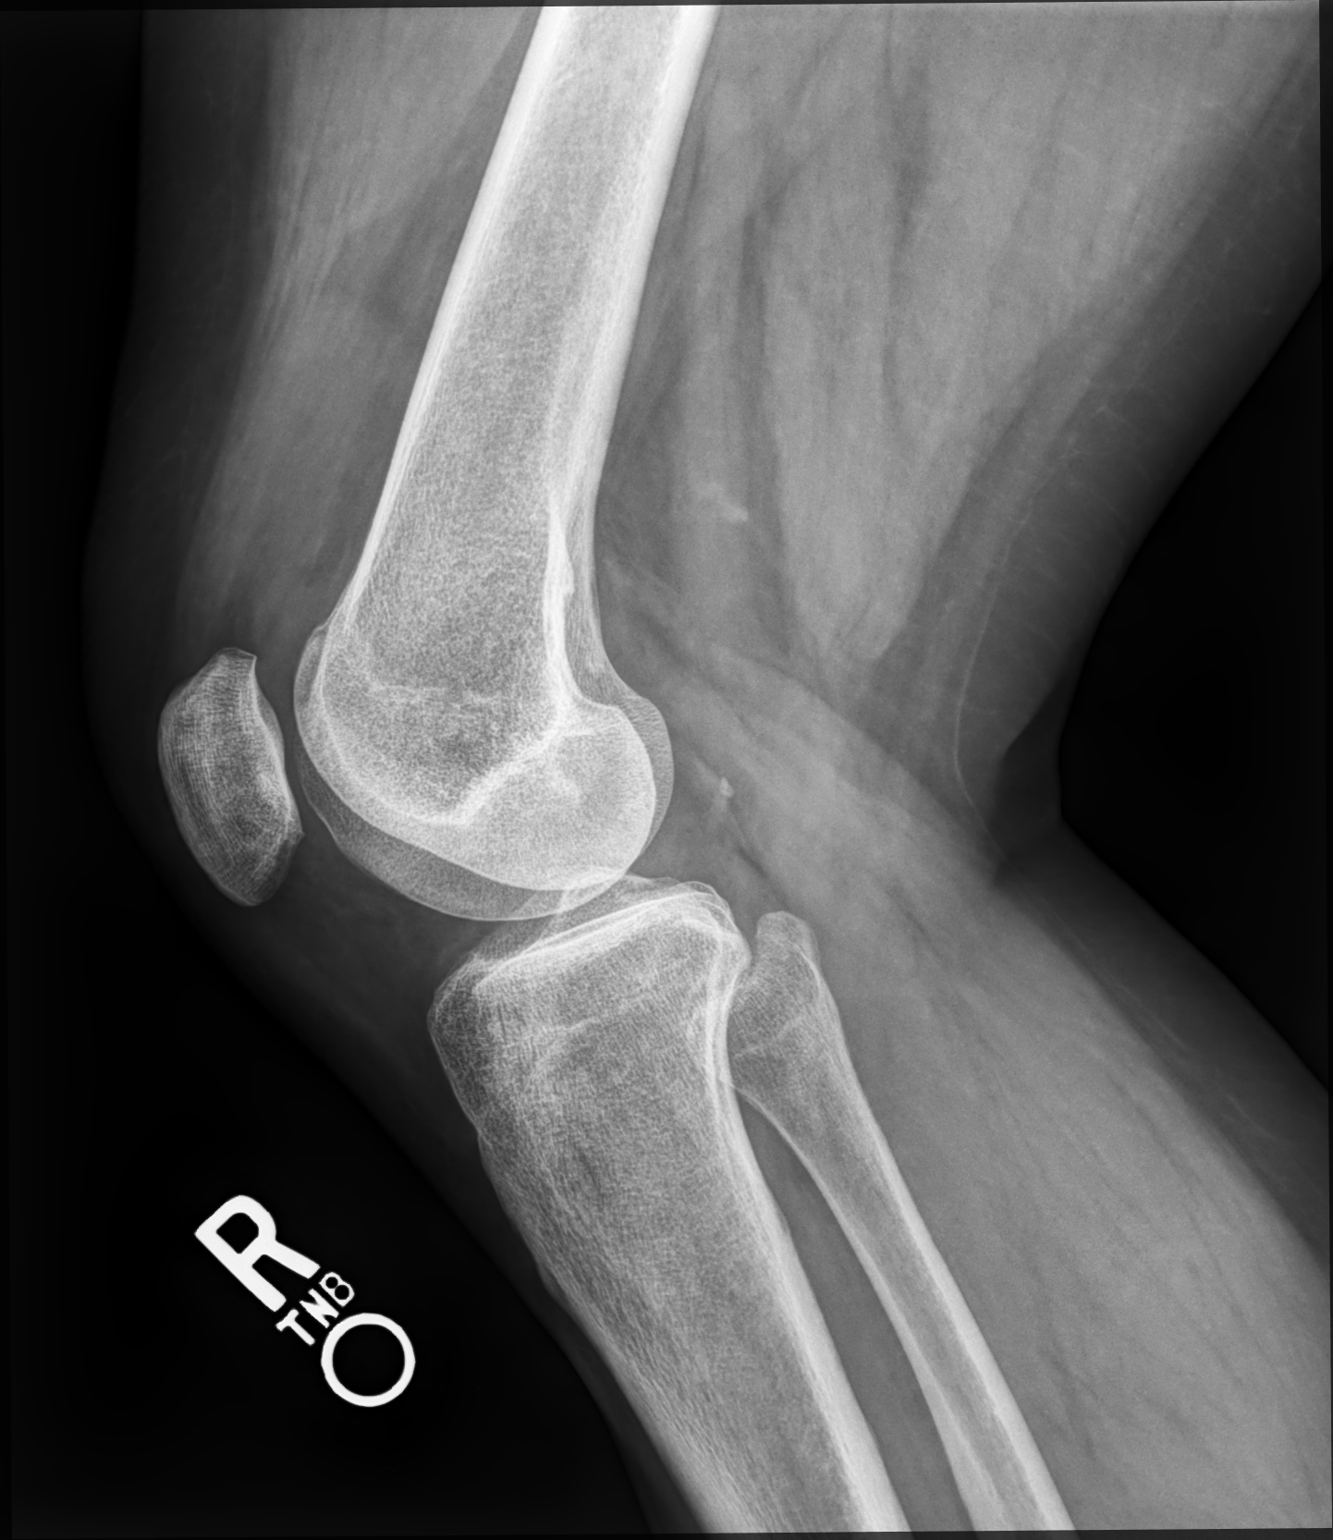

[2 of 2 positions shown; findings below may reference images not displayed]

FINDINGS: Osseous mineralization normal for technique.

Joint spaces preserved.

No acute fracture, dislocation, or bone destruction.

Small joint effusion.
IMPRESSION: Small knee joint effusion.

No acute osseous abnormalities.

## 2021-09-09 DIAGNOSIS — H9203 Otalgia, bilateral: Secondary | ICD-10-CM | POA: Diagnosis not present

## 2021-09-09 DIAGNOSIS — M542 Cervicalgia: Secondary | ICD-10-CM | POA: Diagnosis not present

## 2021-09-09 DIAGNOSIS — J3489 Other specified disorders of nose and nasal sinuses: Secondary | ICD-10-CM | POA: Diagnosis not present

## 2022-06-12 DIAGNOSIS — M5451 Vertebrogenic low back pain: Secondary | ICD-10-CM | POA: Diagnosis not present

## 2022-06-12 DIAGNOSIS — M25562 Pain in left knee: Secondary | ICD-10-CM | POA: Diagnosis not present

## 2022-06-12 DIAGNOSIS — M25561 Pain in right knee: Secondary | ICD-10-CM | POA: Diagnosis not present

## 2022-06-27 ENCOUNTER — Emergency Department (HOSPITAL_COMMUNITY): Payer: Medicaid Other

## 2022-06-27 ENCOUNTER — Encounter (HOSPITAL_COMMUNITY): Payer: Self-pay

## 2022-06-27 ENCOUNTER — Emergency Department (HOSPITAL_COMMUNITY)
Admission: EM | Admit: 2022-06-27 | Discharge: 2022-06-27 | Disposition: A | Discharge status: Home / Self Care | Payer: Medicaid Other | Attending: Emergency Medicine | Admitting: Emergency Medicine

## 2022-06-27 DIAGNOSIS — M5442 Lumbago with sciatica, left side: Secondary | ICD-10-CM | POA: Insufficient documentation

## 2022-06-27 DIAGNOSIS — M5127 Other intervertebral disc displacement, lumbosacral region: Secondary | ICD-10-CM | POA: Diagnosis not present

## 2022-06-27 DIAGNOSIS — M5441 Lumbago with sciatica, right side: Secondary | ICD-10-CM | POA: Insufficient documentation

## 2022-06-27 DIAGNOSIS — M4807 Spinal stenosis, lumbosacral region: Secondary | ICD-10-CM | POA: Diagnosis not present

## 2022-06-27 DIAGNOSIS — M545 Low back pain, unspecified: Secondary | ICD-10-CM | POA: Diagnosis present

## 2022-06-27 DIAGNOSIS — M48061 Spinal stenosis, lumbar region without neurogenic claudication: Secondary | ICD-10-CM | POA: Diagnosis not present

## 2022-06-27 DIAGNOSIS — N3001 Acute cystitis with hematuria: Secondary | ICD-10-CM | POA: Insufficient documentation

## 2022-06-27 DIAGNOSIS — M5126 Other intervertebral disc displacement, lumbar region: Secondary | ICD-10-CM | POA: Diagnosis not present

## 2022-06-27 LAB — CBC WITH DIFFERENTIAL/PLATELET
Abs Immature Granulocytes: 0.04 10*3/uL (ref 0.00–0.07)
Basophils Absolute: 0.1 10*3/uL (ref 0.0–0.1)
Basophils Relative: 0 %
Eosinophils Absolute: 0.3 10*3/uL (ref 0.0–0.5)
Eosinophils Relative: 3 %
HCT: 40.7 % (ref 36.0–46.0)
Hemoglobin: 13.4 g/dL (ref 12.0–15.0)
Immature Granulocytes: 0 %
Lymphocytes Relative: 22 %
Lymphs Abs: 2.6 10*3/uL (ref 0.7–4.0)
MCH: 30.2 pg (ref 26.0–34.0)
MCHC: 32.9 g/dL (ref 30.0–36.0)
MCV: 91.7 fL (ref 80.0–100.0)
Monocytes Absolute: 0.7 10*3/uL (ref 0.1–1.0)
Monocytes Relative: 6 %
Neutro Abs: 8 10*3/uL — ABNORMAL HIGH (ref 1.7–7.7)
Neutrophils Relative %: 69 %
Platelets: 323 10*3/uL (ref 150–400)
RBC: 4.44 MIL/uL (ref 3.87–5.11)
RDW: 13.3 % (ref 11.5–15.5)
WBC: 11.7 10*3/uL — ABNORMAL HIGH (ref 4.0–10.5)
nRBC: 0 % (ref 0.0–0.2)

## 2022-06-27 LAB — BASIC METABOLIC PANEL
Anion gap: 10 (ref 5–15)
BUN: 12 mg/dL (ref 6–20)
CO2: 26 mmol/L (ref 22–32)
Calcium: 9.3 mg/dL (ref 8.9–10.3)
Chloride: 106 mmol/L (ref 98–111)
Creatinine, Ser: 0.59 mg/dL (ref 0.44–1.00)
GFR, Estimated: >60 mL/min (ref >60)
Glucose, Bld: 109 mg/dL — ABNORMAL HIGH (ref 70–99)
Potassium: 4 mmol/L (ref 3.5–5.1)
Sodium: 142 mmol/L (ref 135–145)

## 2022-06-27 LAB — URINALYSIS, ROUTINE W REFLEX MICROSCOPIC
Bilirubin Urine: NEGATIVE
Glucose, UA: NEGATIVE mg/dL
Ketones, ur: NEGATIVE mg/dL
Nitrite: NEGATIVE
Protein, ur: NEGATIVE mg/dL
Specific Gravity, Urine: 1.011 (ref 1.005–1.030)
pH: 7 (ref 5.0–8.0)

## 2022-06-27 MED ORDER — CEPHALEXIN 250 MG PO CAPS: 250.0000 mg | ORAL_CAPSULE | Freq: Four times a day (QID) | ORAL | 0 refills | Status: AC

## 2022-06-27 MED ORDER — LORAZEPAM 2 MG/ML IJ SOLN
0.5000 mg | Freq: Once | INTRAMUSCULAR | Status: AC | PRN
  Administered 2022-06-27: 0.5 mg via INTRAVENOUS
  Filled 2022-06-27: qty 1

## 2022-06-27 MED ORDER — MELOXICAM 7.5 MG PO TABS: 15.0000 mg | ORAL_TABLET | Freq: Every day | ORAL | 0 refills | Status: AC

## 2022-06-27 MED ORDER — PREDNISONE 10 MG (21) PO TBPK: ORAL_TABLET | Freq: Every day | ORAL | 0 refills | Status: DC

## 2022-06-27 MED ORDER — DEXAMETHASONE SODIUM PHOSPHATE 10 MG/ML IJ SOLN
10.0000 mg | Freq: Once | INTRAMUSCULAR | Status: AC
  Administered 2022-06-27: 10 mg via INTRAVENOUS
  Filled 2022-06-27: qty 1

## 2022-06-27 NOTE — ED Provider Notes (Signed)
[facility] DEPT Provider Note   CSN: [id number] Arrival date & time: 06/27/22  1609     History  Chief Complaint  Patient presents with   Back Pain    Paige Villarreal is a 56 y.o. female.  Patient presents to the hospital complaining of low back pain with radicular symptoms and new onset urinary incontinence.  Patient is followed by orthopedics for the same condition.  Patient was scheduled to have an MRI outpatient tomorrow morning with follow-up on Wednesday afternoon.  Due to the urinary incontinence the patient's orthopedic surgeon recommended she come to the emergency department for an emergent MRI to rule out ruptured disc. Patient states she has been treated for the past month with steroids, methocarbamol, meloxicam.  Patient states while on all 3 medications she feels better.  As medications were off she feels much worse.  Patient states she has been out of her arthritis medications for 2 days.  States that last night she had 1-2 episodes of urinary urgency.  Today she had an episode of urinary incontinence.  This is a new problem for the patient.  Patient denies weakness or loss of sensation in lower extremities.  Patient is ambulatory.  Patient sees Dr. Maxie Better with [facility].  Past medical history significant for urinary incontinence (patient states it is a new problem), anxiety, duodenal ulcer  HPI     Home Medications Prior to Admission medications   Medication Sig Start Date End Date Taking? Authorizing Provider  5-Hydroxytryptophan (5-HTP PO) Take 1 capsule by mouth daily.   Yes [provider]  cephALEXin (KEFLEX) 250 MG capsule Take 1 capsule (250 mg total) by mouth 4 (four) times daily for 5 days. 06/27/22 07/02/22 Yes Silvio Sausedo, Casimer Leek, PA-C  CRANBERRY PO Take 2-4 tablets by mouth daily as needed (uti).   Yes [provider]  methocarbamol (ROBAXIN) 500 MG tablet Take 1,000 mg by mouth 3 (three) times daily. 06/25/22  Yes [provider]  predniSONE (STERAPRED UNI-PAK 21 TAB) 10 MG (21) TBPK tablet Take by mouth daily. Take 6 tabs by mouth daily  for 2 days, then 5 tabs for 2 days, then 4 tabs for 2 days, then 3 tabs for 2 days, 2 tabs for 2 days, then 1 tab by mouth daily for 2 days 06/27/22  Yes Dorothyann Peng, PA-C  TURMERIC CURCUMIN PO Take 15 mLs by mouth daily.   Yes [provider]  Choline-Inositol-Methionine (LIPO) 50-50-25 MG/ML SOLN Inject 1 Syringe into the muscle once a week.    [provider]  Cyanocobalamin (VITAMIN B-12 IJ) Inject 1 Syringe as directed every 30 (thirty) days.    [provider]  diphenhydrAMINE (BENADRYL) 25 MG tablet Take 25 mg by mouth every 6 (six) hours as needed (hives, itching, swelling.).    [provider]  famotidine (PEPCID) 20 MG tablet Take 1 tablet (20 mg total) by mouth daily for 5 days. 03/16/20 03/21/20  Tegeler, Gwenyth Allegra, MD  FLUoxetine (PROZAC) 10 MG tablet Take one tablet daily Patient taking differently: Take 10 mg by mouth daily.  08/09/14   Huel Cote, NP  human chorionic gonadotropin (PREGNYL/NOVAREL) 10000 units injection Inject 10,000 Units into the muscle once.    [provider]  hydrOXYzine (ATARAX/VISTARIL) 25 MG tablet Take 25-50 mg by mouth See admin instructions. Every 6-8 hours as needed for itching.    [provider]  meloxicam (MOBIC) 7.5 MG tablet Take 2 tablets (15 mg total)  by mouth daily for 10 days. 06/27/22 07/07/22  Dorothyann Peng, PA-C  tiZANidine (ZANAFLEX) 4 MG tablet Take 0.5-1 tablets (2-4 mg total) by mouth every 6 (six) hours as needed for muscle spasms. Patient not taking: Reported on 06/27/2022 05/02/21   Scot Jun, FNP      Allergies    Nitrofuran derivatives    Review of Systems   Review of Systems  Constitutional:  Negative for fever.  Respiratory:  Negative for shortness of breath.   Cardiovascular:  Negative for chest pain.  Gastrointestinal:  Negative  for abdominal pain, nausea and vomiting.  Genitourinary:  Positive for urgency.       Patient describes increased urinary frequency and episode of incontinence this morning  Musculoskeletal:  Positive for back pain.       Patient complains of low back pain with symptoms running down the lateral portions of both legs    Physical Exam Updated Vital Signs BP (!) 155/107   Pulse 91   Temp 98.3 F (36.8 C) (Oral)   Resp 16   Ht '5\' 1"'$  (1.549 m)   Wt 72.6 kg   LMP 08/06/2014   SpO2 99%   BMI 30.23 kg/m  Physical Exam Vitals and nursing note reviewed.  Constitutional:      General: She is not in acute distress. HENT:     Head: Normocephalic and atraumatic.  Eyes:     Extraocular Movements: Extraocular movements intact.     Conjunctiva/sclera: Conjunctivae normal.     Pupils: Pupils are equal, round, and reactive to light.  Cardiovascular:     Rate and Rhythm: Normal rate and regular rhythm.     Pulses: Normal pulses.     Heart sounds: Normal heart sounds.  Pulmonary:     Effort: Pulmonary effort is normal.     Breath sounds: Normal breath sounds.  Musculoskeletal:     Cervical back: Normal range of motion and neck supple.  Neurological:     Mental Status: She is alert.     Sensory: Sensation is intact.     Motor: Motor function is intact.     Coordination: Heel to Shin Test normal.     Comments: Cranial nerves II through VII, XI, XII intact     ED Results / Procedures / Treatments   Labs (all labs ordered are listed, but only abnormal results are displayed) Labs Reviewed  BASIC METABOLIC PANEL - Abnormal; Notable for the following components:      Result Value   Glucose, Bld 109 (*)    All other components within normal limits  CBC WITH DIFFERENTIAL/PLATELET - Abnormal; Notable for the following components:   WBC 11.7 (*)    Neutro Abs 8.0 (*)    All other components within normal limits  URINALYSIS, ROUTINE W REFLEX MICROSCOPIC - Abnormal; Notable for the  following components:   Color, Urine STRAW (*)    Hgb urine dipstick SMALL (*)    Leukocytes,Ua LARGE (*)    Bacteria, UA RARE (*)    All other components within normal limits    EKG None  Radiology MR LUMBAR SPINE WO CONTRAST  Result Date: 06/27/2022 CLINICAL DATA:  Initial evaluation for lumbar radiculopathy. EXAM: MRI LUMBAR SPINE WITHOUT CONTRAST TECHNIQUE: Multiplanar, multisequence MR imaging of the lumbar spine was performed. No intravenous contrast was administered. COMPARISON:  None Available. FINDINGS: Segmentation: Standard. Lowest well-formed disc space labeled the L5-S1 level. Alignment: Physiologic with preservation of the normal lumbar lordosis. No listhesis. Vertebrae:  Vertebral body height maintained without acute or chronic fracture. Bone marrow signal intensity within normal limits. No discrete or worrisome osseous lesions. No abnormal marrow edema. Conus medullaris and cauda equina: Conus extends to the L1 level. Conus and cauda equina appear normal. Paraspinal and other soft tissues: Unremarkable. Disc levels: L1-2:  Normal interspace.  Mild facet hypertrophy.  No stenosis. L2-3: Degenerative intervertebral disc space narrowing with diffuse disc bulge and disc desiccation. Associated reactive endplate spurring. Mild facet hypertrophy. Underlying short pedicles with resultant mild spinal stenosis. Foramina remain patent. L3-4: Mild intervertebral disc space narrowing with diffuse disc bulge and disc desiccation. Mild reactive endplate spurring. Superimposed small central disc protrusion with slight superior migration and annular fissure. Mild to moderate facet hypertrophy with trace joint effusions. Underlying short pedicles. Resultant moderate spinal stenosis. Mild bilateral L3 foraminal narrowing. L4-5: Disc desiccation. Shallow left foraminal to extraforaminal disc protrusion contacts the exiting left L4 nerve root (series 12, image 30). Mild facet hypertrophy. No significant  spinal stenosis. Mild to moderate left L4 foraminal stenosis. Right neural foramen is patent. L5-S1: Disc desiccation. Broad-based left subarticular to foraminal disc protrusion with slight inferior migration. Protruding disc contacts and mildly displaces the descending left S1 nerve root (series 12, image 37). Superimposed mild facet hypertrophy. Resultant moderate left lateral recess stenosis. Central canal remains patent. No significant foraminal stenosis. IMPRESSION: 1. Left subarticular to foraminal disc protrusion at L5-S1, contacting and mildly displacing the descending left S1 nerve root. 2. Shallow left foraminal to extraforaminal disc protrusion at L4-5, contacting and potentially irritating the exiting left L4 nerve root. 3. Disc bulging with facet hypertrophy at L2-3 and L3-4 with resultant mild to moderate spinal stenosis as above. Electronically Signed   By: Jeannine Boga M.D.   On: 06/27/2022 20:49    Procedures Procedures    Medications Ordered in ED Medications  LORazepam (ATIVAN) injection 0.5 mg (0.5 mg Intravenous Given 06/27/22 1918)  dexamethasone (DECADRON) injection 10 mg (10 mg Intravenous Given 06/27/22 2140)    ED Course/ Medical Decision Making/ A&P                           Medical Decision Making Amount and/or Complexity of Data Reviewed Labs: ordered. Radiology: ordered.  Risk Prescription drug management.   Patient presents with worsening low back pain and urinary incontinence.  Differential includes but is not limited to disc herniation, worsening degenerative disc disease, cauda equina, and others  I ordered and reviewed lab results.  Urinalysis with signs of possible infection including large leukocytes.  WBC 11.7.  Glucose 109.  I ordered and interpreted imaging including MRI lumbar spine. 1. Left subarticular to foraminal disc protrusion at L5-S1, contacting and mildly displacing the descending left S1 nerve root. 2. Shallow left foraminal to  extraforaminal disc protrusion at L4-5, contacting and potentially irritating the exiting left L4 nerve root. 3. Disc bulging with facet hypertrophy at L2-3 and L3-4 with resultant mild to moderate spinal stenosis as above.  I agree with the radiologist findings  I ordered the patient Ativan for anxiety for the MRI.  The patient had no issues with sitting still for MRI. I also ordered decadron for inflammation.   I discussed the case with Dr. Tonita Cong, orthopedic surgery.  The patient is currently under his care outpatient.  Reviewed MRI results.  Recommended steroid Dosepak and follow-up with his office on Wednesday.  At this time the patient appears to have herniated disks.  No  signs of cauda equina.  No indication at this time for admission or emergent surgery.  We will discharge patient home with prednisone Dosepak.  Patient may take 15 mg of meloxicam daily until seeing Dr. Maxie Better.  (Patient currently prescribed 7.5 mg daily). Patient also had incidental UTI with mild symptoms. Will prescribe antibiotic.  Return precautions provided       Final Clinical Impression(s) / ED Diagnoses Final diagnoses:  Bilateral low back pain with bilateral sciatica, unspecified chronicity  Lumbosacral disc herniation  Acute cystitis with hematuria    Rx / DC Orders ED Discharge Orders          Ordered    cephALEXin (KEFLEX) 250 MG capsule  4 times daily        06/27/22 2138    predniSONE (STERAPRED UNI-PAK 21 TAB) 10 MG (21) TBPK tablet  Daily        06/27/22 2138    meloxicam (MOBIC) 7.5 MG tablet  Daily        06/27/22 2138              Ronny Bacon 06/27/22 2141    Gareth Morgan, MD 06/28/22 1225

## 2022-06-27 NOTE — ED Notes (Signed)
Attempted to get patient's vitals, but patient had to go to the restroom.

## 2022-06-27 NOTE — Discharge Instructions (Signed)
You were seen today for back pain. MRI shows evidence of herniated discs. I have prescribed a steroid dose pak. You may take '15mg'$  Meloxicam daily. Do NOT exceed '15mg'$  meloxicam daily. Please follow up as planned with Dr.Beane's office on Wednesday. I also prescribed an antibiotic to cover a urinary tract infection.

## 2022-06-27 NOTE — ED Triage Notes (Signed)
Pt arrived via POV, c/o back. States she has ongoing back issues, was scheduled for MRI tomorrow with follow up Wednesday. States she had episode of urinary incontinence today and was told to come to ED.

## 2022-06-28 NOTE — ED Provider Notes (Signed)
Although it appears on our side that prescriptions were sent to [facility], patient had called earlier today stating that the pharmacy had not received the prescription.  I just called [facility] and called in the prescriptions that were sent by the ED provider who saw the patient.  Given that patient is getting Mobic and prednisone, I also added omeprazole twice daily for 7 days.   Varney Biles, MD 06/28/22 1008

## 2022-06-30 DIAGNOSIS — N200 Calculus of kidney: Secondary | ICD-10-CM | POA: Diagnosis not present

## 2022-06-30 DIAGNOSIS — M5451 Vertebrogenic low back pain: Secondary | ICD-10-CM | POA: Diagnosis not present

## 2022-07-04 ENCOUNTER — Encounter (HOSPITAL_COMMUNITY): Payer: Self-pay

## 2022-07-04 ENCOUNTER — Emergency Department (HOSPITAL_COMMUNITY): Payer: Medicaid Other

## 2022-07-04 ENCOUNTER — Emergency Department (HOSPITAL_COMMUNITY)
Admission: EM | Admit: 2022-07-04 | Discharge: 2022-07-05 | Disposition: A | Discharge status: Home / Self Care | Payer: Medicaid Other | Attending: Emergency Medicine | Admitting: Emergency Medicine

## 2022-07-04 ENCOUNTER — Other Ambulatory Visit: Payer: Self-pay

## 2022-07-04 DIAGNOSIS — N3946 Mixed incontinence: Secondary | ICD-10-CM | POA: Diagnosis not present

## 2022-07-04 DIAGNOSIS — M545 Low back pain, unspecified: Secondary | ICD-10-CM | POA: Insufficient documentation

## 2022-07-04 DIAGNOSIS — N200 Calculus of kidney: Secondary | ICD-10-CM | POA: Diagnosis not present

## 2022-07-04 DIAGNOSIS — N3289 Other specified disorders of bladder: Secondary | ICD-10-CM | POA: Diagnosis not present

## 2022-07-04 LAB — URINALYSIS, ROUTINE W REFLEX MICROSCOPIC
Glucose, UA: NEGATIVE mg/dL
Hgb urine dipstick: NEGATIVE
Ketones, ur: 5 mg/dL — AB
Nitrite: NEGATIVE
Protein, ur: 100 mg/dL — AB
Specific Gravity, Urine: 1.038 — ABNORMAL HIGH (ref 1.005–1.030)
pH: 5 (ref 5.0–8.0)

## 2022-07-04 LAB — COMPREHENSIVE METABOLIC PANEL
ALT: 27 U/L (ref 0–44)
AST: 18 U/L (ref 15–41)
Albumin: 4.2 g/dL (ref 3.5–5.0)
Alkaline Phosphatase: 74 U/L (ref 38–126)
Anion gap: 9 (ref 5–15)
BUN: 14 mg/dL (ref 6–20)
CO2: 26 mmol/L (ref 22–32)
Calcium: 9.3 mg/dL (ref 8.9–10.3)
Chloride: 104 mmol/L (ref 98–111)
Creatinine, Ser: 0.86 mg/dL (ref 0.44–1.00)
GFR, Estimated: >60 mL/min (ref >60)
Glucose, Bld: 221 mg/dL — ABNORMAL HIGH (ref 70–99)
Potassium: 3.4 mmol/L — ABNORMAL LOW (ref 3.5–5.1)
Sodium: 139 mmol/L (ref 135–145)
Total Bilirubin: 1 mg/dL (ref 0.3–1.2)
Total Protein: 7.4 g/dL (ref 6.5–8.1)

## 2022-07-04 LAB — CBC WITH DIFFERENTIAL/PLATELET
Abs Immature Granulocytes: 0.08 10*3/uL — ABNORMAL HIGH (ref 0.00–0.07)
Basophils Absolute: 0.1 10*3/uL (ref 0.0–0.1)
Basophils Relative: 1 %
Eosinophils Absolute: 0.3 10*3/uL (ref 0.0–0.5)
Eosinophils Relative: 2 %
HCT: 45.1 % (ref 36.0–46.0)
Hemoglobin: 15 g/dL (ref 12.0–15.0)
Immature Granulocytes: 1 %
Lymphocytes Relative: 28 %
Lymphs Abs: 3.3 10*3/uL (ref 0.7–4.0)
MCH: 30.2 pg (ref 26.0–34.0)
MCHC: 33.3 g/dL (ref 30.0–36.0)
MCV: 90.7 fL (ref 80.0–100.0)
Monocytes Absolute: 0.8 10*3/uL (ref 0.1–1.0)
Monocytes Relative: 7 %
Neutro Abs: 7.2 10*3/uL (ref 1.7–7.7)
Neutrophils Relative %: 61 %
Platelets: 379 10*3/uL (ref 150–400)
RBC: 4.97 MIL/uL (ref 3.87–5.11)
RDW: 13.7 % (ref 11.5–15.5)
WBC: 11.7 10*3/uL — ABNORMAL HIGH (ref 4.0–10.5)
nRBC: 0 % (ref 0.0–0.2)

## 2022-07-04 NOTE — ED Provider Notes (Signed)
[facility] DEPT Provider Note   CSN: [id number] Arrival date & time: 07/04/22  1328     History  Chief Complaint  Patient presents with   Back Pain    Paige Villarreal is a 56 y.o. female with a past medical history of ***who presents to the emergency department complaining of concerns for possible kidney stone onset today.  Patient notes that she was evaluated in the ED on 06/27/2022 due to urinary symptoms and back pain.  At that time had an MRI completed and was discharged home.  Patient is to start physical therapy on 07/08/2022.  Since then she has been evaluated by her orthopedist who completed an x-ray of her spine that found an incidental finding of kidney stone.  Patient was referred to urology however she notes she is unable to get in with them sooner for imaging.  Her main concern tonight is ruling out a kidney stone.  She notes that she is taking her muscle relaxer prior to arrival to the ED.  Denies fever, urinary symptoms, nausea, vomiting, abdominal pain, bowel/bladder incontinence.  Per patient chart review: Patient was evaluated in the emergency department on 06/27/2022 for bilateral lower back pain.  At that time had an MRI completed that had negative findings.  Patient was then evaluated by orthopedist, Dr. Dellis Filbert being on 06/30/2022 as a follow-up head thoracic x-rays completed that found a 0.5 cm kidney stone to the right kidney.  Patient was then referred to urology due to kidney stone on x-ray findings.   The history is provided by the patient. No language interpreter was used.       Home Medications Prior to Admission medications   Medication Sig Start Date End Date Taking? Authorizing Provider  5-Hydroxytryptophan (5-HTP PO) Take 1 capsule by mouth daily.    [provider]  Choline-Inositol-Methionine (LIPO) 50-50-25 MG/ML SOLN Inject 1 Syringe into the muscle once a week.    [provider]  CRANBERRY PO Take 2-4 tablets  by mouth daily as needed (uti).    [provider]  Cyanocobalamin (VITAMIN B-12 IJ) Inject 1 Syringe as directed every 30 (thirty) days.    [provider]  diphenhydrAMINE (BENADRYL) 25 MG tablet Take 25 mg by mouth every 6 (six) hours as needed (hives, itching, swelling.).    [provider]  famotidine (PEPCID) 20 MG tablet Take 1 tablet (20 mg total) by mouth daily for 5 days. 03/16/20 03/21/20  Tegeler, Gwenyth Allegra, MD  FLUoxetine (PROZAC) 10 MG tablet Take one tablet daily Patient taking differently: Take 10 mg by mouth daily.  08/09/14   Huel Cote, NP  human chorionic gonadotropin (PREGNYL/NOVAREL) 10000 units injection Inject 10,000 Units into the muscle once.    [provider]  hydrOXYzine (ATARAX/VISTARIL) 25 MG tablet Take 25-50 mg by mouth See admin instructions. Every 6-8 hours as needed for itching.    [provider]  meloxicam (MOBIC) 7.5 MG tablet Take 2 tablets (15 mg total) by mouth daily for 10 days. 06/27/22 07/07/22  Dorothyann Peng, PA-C  methocarbamol (ROBAXIN) 500 MG tablet Take 1,000 mg by mouth 3 (three) times daily. 06/25/22   [provider]  predniSONE (STERAPRED UNI-PAK 21 TAB) 10 MG (21) TBPK tablet Take by mouth daily. Take 6 tabs by mouth daily  for 2 days, then 5 tabs for 2 days, then 4 tabs for 2 days, then 3 tabs for 2 days, 2 tabs for 2 days, then 1 tab  by mouth daily for 2 days 06/27/22   Cherlynn June B, PA-C  tiZANidine (ZANAFLEX) 4 MG tablet Take 0.5-1 tablets (2-4 mg total) by mouth every 6 (six) hours as needed for muscle spasms. Patient not taking: Reported on 06/27/2022 05/02/21   Scot Jun, FNP  TURMERIC CURCUMIN PO Take 15 mLs by mouth daily.    [provider]      Allergies    Nitrofuran derivatives    Review of Systems   Review of Systems  Constitutional:  Negative for fever.  Gastrointestinal:  Negative for abdominal pain, nausea and vomiting.       -Bowel incontinence   Genitourinary:  Negative for dysuria, hematuria, vaginal bleeding and vaginal discharge.       -Bladder incontinence  Musculoskeletal:  Positive for back pain.  All other systems reviewed and are negative.   Physical Exam Updated Vital Signs BP (!) 172/86 (BP Location: Right Arm)   Pulse 77   Temp 98.6 F (37 C) (Oral)   Resp 18   LMP 08/06/2014   SpO2 100%  Physical Exam Vitals and nursing note reviewed.  Constitutional:      General: She is not in acute distress.    Appearance: She is not diaphoretic.  HENT:     Head: Normocephalic and atraumatic.     Mouth/Throat:     Pharynx: No oropharyngeal exudate.  Eyes:     General: No scleral icterus.    Conjunctiva/sclera: Conjunctivae normal.  Cardiovascular:     Rate and Rhythm: Normal rate and regular rhythm.     Pulses: Normal pulses.     Heart sounds: Normal heart sounds.  Pulmonary:     Effort: Pulmonary effort is normal. No respiratory distress.     Breath sounds: Normal breath sounds. No wheezing.  Abdominal:     General: Bowel sounds are normal.     Palpations: Abdomen is soft. There is no mass.     Tenderness: There is no abdominal tenderness. There is no right CVA tenderness, left CVA tenderness, guarding or rebound.     Comments: No CVA tenderness to palpation noted bilaterally.  Musculoskeletal:        General: Normal range of motion.     Cervical back: Normal range of motion and neck supple.     Comments: Able to ambulate without assistance or difficulty.  No spinal tenderness to palpation.  No tenderness to palpation noted to musculature of back.  Skin:    General: Skin is warm and dry.  Neurological:     Mental Status: She is alert.  Psychiatric:        Behavior: Behavior normal.     ED Results / Procedures / Treatments   Labs (all labs ordered are listed, but only abnormal results are displayed) Labs Reviewed  COMPREHENSIVE METABOLIC PANEL - Abnormal; Notable for the following components:       Result Value   Potassium 3.4 (*)    Glucose, Bld 221 (*)    All other components within normal limits  CBC WITH DIFFERENTIAL/PLATELET - Abnormal; Notable for the following components:   WBC 11.7 (*)    Abs Immature Granulocytes 0.08 (*)    All other components within normal limits  URINALYSIS, ROUTINE W REFLEX MICROSCOPIC - Abnormal; Notable for the following components:   Color, Urine AMBER (*)    APPearance HAZY (*)    Specific Gravity, Urine 1.038 (*)    Bilirubin Urine MODERATE (*)    Ketones, ur 5 (*)  Protein, ur 100 (*)    Leukocytes,Ua MODERATE (*)    Bacteria, UA RARE (*)    All other components within normal limits    EKG None  Radiology No results found.  Procedures Procedures    Medications Ordered in ED Medications - No data to display  ED Course/ Medical Decision Making/ A&P Clinical Course as of 07/04/22 2218  Fri Jul 04, 2022  2218 WBC(!): 11.7 [SB]    Clinical Course User Index [SB] Mikale Silversmith A, PA-C                           Medical Decision Making Amount and/or Complexity of Data Reviewed Labs:  Decision-making details documented in ED Course. Radiology: ordered.   Pt presents with concerns for kidney stone onset today.  Denies past medical history of kidney stone.  Patient was evaluated by her orthopedist with a thoracic x-ray that noted a kidney stone approximately 1.5 cm to the right kidney.  Was sent a referral to [facility].  Patient afebrile. On exam, pt with able to ambulate without assistance or difficulty.  No spinal tenderness to palpation.  No tenderness to palpation noted to musculature of back.  No CVA tenderness to palpation bilaterally. No acute cardiovascular, respiratory, abdominal exam findings. Differential diagnosis includes pyelonephritis, nephrolithiasis, acute cystitis, muscle spasm.    Additional history obtained:  External records from outside source obtained and reviewed including: Patient was evaluated in  the emergency department on 06/27/2022 for bilateral lower back pain.  At that time had an MRI completed that had negative findings.  Patient was then evaluated by orthopedist, Dr. Dellis Filbert being on 06/30/2022 as a follow-up head thoracic x-rays completed that found a 0.5 cm kidney stone to the right kidney.  Patient was then referred to urology due to kidney stone on x-ray findings.  Labs:  I ordered, and personally interpreted labs.  The pertinent results include:   CBC with slightly elevated WBC at 11.7 otherwise unremarkable. CMP with slightly decreased potassium at 3.4, slightly elevated glucose at 221 otherwise unremarkable. Urinalysis noted for moderate amount of bilirubin, moderate leukocytes otherwise nitrate negative.  Imaging: I ordered imaging studies including CT renal stone study I independently visualized and interpreted imaging which showed: *** I agree with the radiologist interpretation  Medications:  I ordered medication including *** for *** Reevaluation of the patient after these medicines and interventions, I reevaluated the patient and found that they have {resolved/improved/worsened:23923::"improved"} I have reviewed the patients home medicines and have made adjustments as needed   {Consultations: I requested consultation with the {sabspecialists:27145}, and discussed lab and imaging findings as well as pertinent plan - they recommend: ***}  Social Determinants of Health: ***   Disposition: {End of MDM here with the likely diagnosis}. After consideration of the diagnostic results and the patients response to treatment, I feel that the patient would benefit from {sabdispo:27146}. Supportive care measures and strict return precautions discussed with patient at bedside. Pt acknowledges and verbalizes understanding. Pt appears safe for discharge. Follow up as indicated in discharge paperwork.    This chart was dictated using voice recognition software, Dragon. Despite the  best efforts of this provider to proofread and correct errors, errors may still occur which can change documentation meaning.   Final Clinical Impression(s) / ED Diagnoses Final diagnoses:  None    Rx / DC Orders ED Discharge Orders     None

## 2022-07-04 NOTE — ED Triage Notes (Signed)
Pt reports chronic back pain from disc disorders, but pt states that ortho found a kidney stone and she is unable to get into urology sooner. She wants to make sure nothing is wrong with her kidneys. Pt also reports progressive neck swelling.

## 2022-07-04 NOTE — ED Provider Triage Note (Cosign Needed Addendum)
Emergency Medicine Provider Triage Evaluation Note  Paige Villarreal , a 56 y.o. female  was evaluated in triage.  Pt complains of multiple complaints.  Patient states that she has been seen by orthopedics for chronic back pain resulting in sciatica and numbness of bilateral upper extremities.  She has had imaging done outpatient as well as an emergency department for both conditions.  She set up to start physical therapy next week for her symptoms.  The orthopedic doctor found an incidental renal calculus on x-ray and sent the patient to urology.  Patient had a urologist appointment earlier today who said that her upper back pain is most likely not related to a stone and told her to come to the emergency department if her symptoms got worse.  She does not complain currently that her symptoms are worse.  She does note that she is out with her tramadol on Monday.  She has been taking meloxicam, methocarbamol, prednisone, tramadol for pain.  Denies fever, chills, night sweats, chest pain, shortness of breath, abdominal pain, nausea/vomiting/diarrhea, urinary/vaginal symptoms, change in bowel habits.  Denies history of IV drug use, saddle anesthesia, weakness/sensory deficits of lower extremities, bowel/bladder dysfunction.  Review of Systems  Positive: See above Negative:   Physical Exam  BP (!) 143/80 (BP Location: Left Arm)   Pulse 99   Temp 98.7 F (37.1 C) (Oral)   Resp 16   LMP 08/06/2014   SpO2 98%  Gen:   Awake, no distress   Resp:  Normal effort  MSK:   Moves extremities without difficulty  Other:    Medical Decision Making  Medically screening exam initiated at 2:19 PM.  Appropriate orders placed.  Paige Villarreal was informed that the remainder of the evaluation will be completed by another provider, this initial triage assessment does not replace that evaluation, and the importance of remaining in the ED until their evaluation is complete.     Paige Villarreal, [place] 07/04/22 [place], [employer], [place] 07/04/22 1431

## 2022-07-05 NOTE — Discharge Instructions (Signed)
It was a pleasure taking care of you today!  Your CT scan did not show any kidney stone today.  Your urine looked improved from the previous urine sample 7 days ago.  There is no concern for UTI at this time.  Your glucose today was slightly elevated today in the ED, this is likely due to the prednisone that you are taking.   Call your primary care provider and set up a follow-up appoint regarding today's ED visit.  It is important that you maintain follow-up with your orthopedist as well as your urologist regarding today's ED visit.  You may continue taking your medications as prescribed to you.  Return to the ED if you are experiencing increasing/worsening back pain, urinating or having a bowel movement yourself by not being aware, fever, worsening symptoms.

## 2022-07-08 DIAGNOSIS — M5451 Vertebrogenic low back pain: Secondary | ICD-10-CM | POA: Diagnosis not present

## 2022-07-16 DIAGNOSIS — M5451 Vertebrogenic low back pain: Secondary | ICD-10-CM | POA: Diagnosis not present

## 2022-09-11 DIAGNOSIS — E669 Obesity, unspecified: Secondary | ICD-10-CM | POA: Diagnosis not present

## 2022-09-11 DIAGNOSIS — Z111 Encounter for screening for respiratory tuberculosis: Secondary | ICD-10-CM | POA: Diagnosis not present

## 2022-09-11 DIAGNOSIS — Z0184 Encounter for antibody response examination: Secondary | ICD-10-CM | POA: Diagnosis not present

## 2022-09-11 DIAGNOSIS — Z5320 Procedure and treatment not carried out because of patient's decision for unspecified reasons: Secondary | ICD-10-CM | POA: Diagnosis not present

## 2022-09-11 DIAGNOSIS — Z0000 Encounter for general adult medical examination without abnormal findings: Secondary | ICD-10-CM | POA: Diagnosis not present

## 2022-09-11 DIAGNOSIS — R7303 Prediabetes: Secondary | ICD-10-CM | POA: Diagnosis not present

## 2022-12-04 DIAGNOSIS — Z8249 Family history of ischemic heart disease and other diseases of the circulatory system: Secondary | ICD-10-CM | POA: Diagnosis not present

## 2022-12-04 DIAGNOSIS — R400 Somnolence: Secondary | ICD-10-CM | POA: Diagnosis not present

## 2022-12-04 DIAGNOSIS — R5383 Other fatigue: Secondary | ICD-10-CM | POA: Diagnosis not present

## 2022-12-04 DIAGNOSIS — E7800 Pure hypercholesterolemia, unspecified: Secondary | ICD-10-CM | POA: Diagnosis not present

## 2022-12-04 DIAGNOSIS — R0789 Other chest pain: Secondary | ICD-10-CM | POA: Diagnosis not present

## 2022-12-12 DIAGNOSIS — G4710 Hypersomnia, unspecified: Secondary | ICD-10-CM | POA: Diagnosis not present

## 2023-01-12 DIAGNOSIS — R350 Frequency of micturition: Secondary | ICD-10-CM | POA: Diagnosis not present

## 2023-02-24 DIAGNOSIS — J029 Acute pharyngitis, unspecified: Secondary | ICD-10-CM | POA: Diagnosis not present

## 2023-02-24 DIAGNOSIS — H6692 Otitis media, unspecified, left ear: Secondary | ICD-10-CM | POA: Diagnosis not present

## 2023-04-07 DIAGNOSIS — M25562 Pain in left knee: Secondary | ICD-10-CM | POA: Diagnosis not present

## 2023-04-07 DIAGNOSIS — M5451 Vertebrogenic low back pain: Secondary | ICD-10-CM | POA: Diagnosis not present

## 2023-04-14 ENCOUNTER — Emergency Department (HOSPITAL_COMMUNITY): Payer: Medicaid Other

## 2023-04-14 ENCOUNTER — Other Ambulatory Visit: Payer: Self-pay

## 2023-04-14 ENCOUNTER — Emergency Department (HOSPITAL_COMMUNITY)
Admission: EM | Admit: 2023-04-14 | Discharge: 2023-04-14 | Disposition: A | Discharge status: Home / Self Care | Payer: Medicaid Other | Attending: Emergency Medicine | Admitting: Emergency Medicine

## 2023-04-14 DIAGNOSIS — R079 Chest pain, unspecified: Secondary | ICD-10-CM

## 2023-04-14 DIAGNOSIS — E876 Hypokalemia: Secondary | ICD-10-CM | POA: Insufficient documentation

## 2023-04-14 DIAGNOSIS — R0789 Other chest pain: Secondary | ICD-10-CM | POA: Diagnosis not present

## 2023-04-14 DIAGNOSIS — R072 Precordial pain: Secondary | ICD-10-CM | POA: Insufficient documentation

## 2023-04-14 LAB — BASIC METABOLIC PANEL
Anion gap: 4 — ABNORMAL LOW (ref 5–15)
BUN: 14 mg/dL (ref 6–20)
CO2: 25 mmol/L (ref 22–32)
Calcium: 8.6 mg/dL — ABNORMAL LOW (ref 8.9–10.3)
Chloride: 106 mmol/L (ref 98–111)
Creatinine, Ser: 0.55 mg/dL (ref 0.44–1.00)
GFR, Estimated: >60 mL/min (ref >60)
Glucose, Bld: 116 mg/dL — ABNORMAL HIGH (ref 70–99)
Potassium: 3.1 mmol/L — ABNORMAL LOW (ref 3.5–5.1)
Sodium: 135 mmol/L (ref 135–145)

## 2023-04-14 LAB — CBC WITH DIFFERENTIAL/PLATELET
Abs Immature Granulocytes: 0.05 10*3/uL (ref 0.00–0.07)
Basophils Absolute: 0.1 10*3/uL (ref 0.0–0.1)
Basophils Relative: 1 %
Eosinophils Absolute: 0.3 10*3/uL (ref 0.0–0.5)
Eosinophils Relative: 3 %
HCT: 41.5 % (ref 36.0–46.0)
Hemoglobin: 13.6 g/dL (ref 12.0–15.0)
Immature Granulocytes: 1 %
Lymphocytes Relative: 29 %
Lymphs Abs: 3 10*3/uL (ref 0.7–4.0)
MCH: 29.7 pg (ref 26.0–34.0)
MCHC: 32.8 g/dL (ref 30.0–36.0)
MCV: 90.6 fL (ref 80.0–100.0)
Monocytes Absolute: 0.9 10*3/uL (ref 0.1–1.0)
Monocytes Relative: 9 %
Neutro Abs: 6.2 10*3/uL (ref 1.7–7.7)
Neutrophils Relative %: 57 %
Platelets: 359 10*3/uL (ref 150–400)
RBC: 4.58 MIL/uL (ref 3.87–5.11)
RDW: 13.2 % (ref 11.5–15.5)
WBC: 10.6 10*3/uL — ABNORMAL HIGH (ref 4.0–10.5)
nRBC: 0 % (ref 0.0–0.2)

## 2023-04-14 LAB — MAGNESIUM: Magnesium: 2.2 mg/dL (ref 1.7–2.4)

## 2023-04-14 LAB — TROPONIN I (HIGH SENSITIVITY): Troponin I (High Sensitivity): 4 ng/L (ref <18)

## 2023-04-14 MED ORDER — POTASSIUM CHLORIDE CRYS ER 20 MEQ PO TBCR
40.0000 meq | EXTENDED_RELEASE_TABLET | Freq: Once | ORAL | Status: AC
  Administered 2023-04-14: 40 meq via ORAL
  Filled 2023-04-14: qty 2

## 2023-04-14 MED ORDER — ACETAMINOPHEN 325 MG PO TABS
650.0000 mg | ORAL_TABLET | Freq: Once | ORAL | Status: AC
  Administered 2023-04-14: 650 mg via ORAL
  Filled 2023-04-14: qty 2

## 2023-04-14 NOTE — ED Provider Notes (Signed)
[place] EMERGENCY DEPARTMENT AT [facility] Provider Note   CSN: [id number] Arrival date & time: 04/14/23  1423     History  Chief Complaint  Patient presents with   Chest Pain   Tingling   Anxiety    Paige Villarreal is a 57 y.o. female.  Nicotine use with vaping presenting to the emergency department with chest pain.  The patient states that about a week ago she had pulled out her back and has been on a steroid taper and muscle relaxer.  She states that her back has been improving however the last few days she started to have increasing sinus congestion.  She states that she has been using Flonase and took a decongestant this morning and knows of her symptoms over taking this decongestant.  She states that shortly after that she started to develop a sharp midsternal chest pain with shortness of breath and tingling of her arms and legs.  She states that her shortness of breath has resolved but she has continued to have chest pain throughout the day but will wax and wane from a 3 out of 10 to an 8 out of 10.  She denies any recent fever or cough, lower extremity swelling, history of blood clots, recent hospitalization or surgery, hormone use or cancer history.  The history is provided by the patient.  Chest Pain Associated symptoms: anxiety   Anxiety Associated symptoms include chest pain.       Home Medications Prior to Admission medications   Medication Sig Start Date End Date Taking? Authorizing Provider  5-Hydroxytryptophan (5-HTP PO) Take 1 capsule by mouth daily.    [provider]  Choline-Inositol-Methionine (LIPO) 50-50-25 MG/ML SOLN Inject 1 Syringe into the muscle once a week.    [provider]  CRANBERRY PO Take 2-4 tablets by mouth daily as needed (uti).    [provider]  Cyanocobalamin (VITAMIN B-12 IJ) Inject 1 Syringe as directed every 30 (thirty) days.    [provider]  diphenhydrAMINE (BENADRYL) 25 MG tablet Take  25 mg by mouth every 6 (six) hours as needed (hives, itching, swelling.).    [provider]  famotidine (PEPCID) 20 MG tablet Take 1 tablet (20 mg total) by mouth daily for 5 days. 03/16/20 03/21/20  Tegeler, Canary Brim, MD  FLUoxetine (PROZAC) 10 MG tablet Take one tablet daily Patient taking differently: Take 10 mg by mouth daily.  08/09/14   Harrington Challenger, NP  human chorionic gonadotropin (PREGNYL/NOVAREL) 10000 units injection Inject 10,000 Units into the muscle once.    [provider]  hydrOXYzine (ATARAX/VISTARIL) 25 MG tablet Take 25-50 mg by mouth See admin instructions. Every 6-8 hours as needed for itching.    [provider]  methocarbamol (ROBAXIN) 500 MG tablet Take 1,000 mg by mouth 3 (three) times daily. 06/25/22   [provider]  predniSONE (STERAPRED UNI-PAK 21 TAB) 10 MG (21) TBPK tablet Take by mouth daily. Take 6 tabs by mouth daily  for 2 days, then 5 tabs for 2 days, then 4 tabs for 2 days, then 3 tabs for 2 days, 2 tabs for 2 days, then 1 tab by mouth daily for 2 days 06/27/22   Barrie Dunker B, PA-C  tiZANidine (ZANAFLEX) 4 MG tablet Take 0.5-1 tablets (2-4 mg total) by mouth every 6 (six) hours as needed for muscle spasms. Patient not taking: Reported on 06/27/2022 05/02/21   Bing Neighbors, NP  TURMERIC CURCUMIN PO Take 15  mLs by mouth daily.    [provider]      Allergies    Ciprofloxacin, Nitrofuran derivatives, Oxybutynin, and Other    Review of Systems   Review of Systems  Cardiovascular:  Positive for chest pain.    Physical Exam Updated Vital Signs BP (!) 145/76   Pulse 83   Temp 98.5 F (36.9 C)   Resp 20   Wt 72 kg   LMP 08/06/2014   SpO2 99%   BMI 29.99 kg/m  Physical Exam Vitals and nursing note reviewed.  Constitutional:      General: She is not in acute distress.    Appearance: She is well-developed.  HENT:     Head: Normocephalic and atraumatic.  Eyes:     Extraocular Movements:  Extraocular movements intact.  Cardiovascular:     Rate and Rhythm: Normal rate and regular rhythm.     Pulses:          Radial pulses are 2+ on the right side and 2+ on the left side.     Heart sounds: Normal heart sounds.  Pulmonary:     Effort: Pulmonary effort is normal.     Breath sounds: Normal breath sounds.  Abdominal:     Palpations: Abdomen is soft.     Tenderness: There is no abdominal tenderness.  Musculoskeletal:        General: Normal range of motion.     Cervical back: Normal range of motion and neck supple.     Right lower leg: No edema.     Left lower leg: No edema.  Skin:    General: Skin is warm and dry.  Neurological:     General: No focal deficit present.     Mental Status: She is alert and oriented to person, place, and time.  Psychiatric:        Mood and Affect: Mood normal.        Behavior: Behavior normal.     ED Results / Procedures / Treatments   Labs (all labs ordered are listed, but only abnormal results are displayed) Labs Reviewed  BASIC METABOLIC PANEL - Abnormal; Notable for the following components:      Result Value   Potassium 3.1 (*)    Glucose, Bld 116 (*)    Calcium 8.6 (*)    Anion gap 4 (*)    All other components within normal limits  CBC WITH DIFFERENTIAL/PLATELET - Abnormal; Notable for the following components:   WBC 10.6 (*)    All other components within normal limits  MAGNESIUM  URINALYSIS, ROUTINE W REFLEX MICROSCOPIC  TROPONIN I (HIGH SENSITIVITY)    EKG EKG Interpretation  Date/Time:  Tuesday April 14 2023 16:22:28 EDT Ventricular Rate:  71 PR Interval:  168 QRS Duration: 94 QT Interval:  402 QTC Calculation: 437 R Axis:   75 Text Interpretation: Sinus rhythm Confirmed by Elayne Snare ([id number]) on 04/14/2023 4:44:00 PM  Radiology DG Chest 2 View  Result Date: 04/14/2023 CLINICAL DATA:  Chest pain for 1 week. EXAM: CHEST - 2 VIEW COMPARISON:  03/14/2020 FINDINGS: The cardiac silhouette, mediastinal and  hilar contours are normal. The lungs are clear. No pleural effusions. No pulmonary lesions. No pneumothorax. The bony thorax is intact. IMPRESSION: No acute cardiopulmonary findings. Electronically Signed   By: Rudie Meyer M.D.   On: 04/14/2023 16:37    Procedures Procedures    Medications Ordered in ED Medications  potassium chloride SA (KLOR-CON M) CR tablet 40 mEq (has  no administration in time range)  acetaminophen (TYLENOL) tablet 650 mg (650 mg Oral Given 04/14/23 1607)    ED Course/ Medical Decision Making/ A&P Clinical Course as of 04/14/23 1757  Tue Apr 14, 2023  1730 Labs with mildly low potassium which could be contributing to paresthesias and will be repleted. WBC at baseline and troponin is negative. She has had several hours of symptoms since 10 AM so single troponin is sufficient. She is stable for discharge with outpatient cardiology follow up. [VK]    Clinical Course User Index [VK] Rexford Maus, DO                             Medical Decision Making This patient presents to the ED with chief complaint(s) of chest pain with pertinent past medical history of HLD, nicotine use which further complicates the presenting complaint. The complaint involves an extensive differential diagnosis and also carries with it a high risk of complications and morbidity.    The differential diagnosis includes ACS, arrhythmia, anemia, pulmonary edema, pleural effusion, pneumonia, pneumothorax, viral syndrome, musculoskeletal pain, anxiety  Additional history obtained: Additional history obtained from N/A Records reviewed Care Everywhere/External Records - outpatient cardiology records  ED Course and Reassessment: On patient's arrival to the emergency department she is hemodynamically stable, well-appearing in no acute distress.  Patient will have EKG, labs including troponin and chest x-ray performed to evaluate for cause of her chest pain and she will be given Tylenol for pain.   She will be closely reassessed.  Independent labs interpretation:  The following labs were independently interpreted: mild hypokalemia, otherwise labs within normal range  Independent visualization of imaging: - I independently visualized the following imaging with scope of interpretation limited to determining acute life threatening conditions related to emergency care: CXR, which revealed no acute disease  Consultation: - Consulted or discussed management/test interpretation w/ external professional: N/A  Consideration for admission or further workup: Patient has no emergent conditions requiring admission or further work-up at this time and is stable for discharge home with primary care follow-up  Social Determinants of health: N/A    Amount and/or Complexity of Data Reviewed Labs: ordered. Radiology: ordered.  Risk OTC drugs. Prescription drug management.          Final Clinical Impression(s) / ED Diagnoses Final diagnoses:  Nonspecific chest pain    Rx / DC Orders ED Discharge Orders     None         Rexford Maus, DO 04/14/23 1757

## 2023-04-14 NOTE — Discharge Instructions (Signed)
You were seen in the emergency department for your chest pain.  Your workup showed no signs of heart attack or abnormalities within your heart or lungs.  Your potassium was mildly low and we gave you repletion in the emergency department.  You can follow-up with your primary doctor and your cardiologist to have your symptoms rechecked.  You should return to the emergency department for significantly worsening pain, severe shortness of breath, if you pass out or if you have any other new or concerning symptoms.

## 2023-04-14 NOTE — ED Triage Notes (Addendum)
C/o headache and sinus pressure x 1 week.  Also c/o intermittent CP with sob and feet and hand tingling around lunch.  Pt reports currently on prednisone and robaxin.  Hx anxiety, pt reports recently stressed due to father having heart cath yesterday

## 2023-04-16 DIAGNOSIS — R350 Frequency of micturition: Secondary | ICD-10-CM | POA: Diagnosis not present

## 2023-04-16 DIAGNOSIS — R7303 Prediabetes: Secondary | ICD-10-CM | POA: Diagnosis not present

## 2023-04-16 DIAGNOSIS — E876 Hypokalemia: Secondary | ICD-10-CM | POA: Diagnosis not present

## 2023-04-24 DIAGNOSIS — R7303 Prediabetes: Secondary | ICD-10-CM | POA: Diagnosis not present

## 2023-04-24 DIAGNOSIS — R350 Frequency of micturition: Secondary | ICD-10-CM | POA: Diagnosis not present

## 2023-04-24 DIAGNOSIS — Z13220 Encounter for screening for lipoid disorders: Secondary | ICD-10-CM | POA: Diagnosis not present

## 2023-04-24 DIAGNOSIS — E876 Hypokalemia: Secondary | ICD-10-CM | POA: Diagnosis not present

## 2023-05-14 DIAGNOSIS — R7303 Prediabetes: Secondary | ICD-10-CM | POA: Diagnosis not present

## 2023-05-14 DIAGNOSIS — E785 Hyperlipidemia, unspecified: Secondary | ICD-10-CM | POA: Diagnosis not present

## 2023-05-14 DIAGNOSIS — E669 Obesity, unspecified: Secondary | ICD-10-CM | POA: Diagnosis not present

## 2023-05-14 DIAGNOSIS — N3281 Overactive bladder: Secondary | ICD-10-CM | POA: Diagnosis not present

## 2023-05-21 DIAGNOSIS — E559 Vitamin D deficiency, unspecified: Secondary | ICD-10-CM | POA: Diagnosis not present

## 2023-05-21 DIAGNOSIS — E669 Obesity, unspecified: Secondary | ICD-10-CM | POA: Diagnosis not present

## 2023-05-21 DIAGNOSIS — G4730 Sleep apnea, unspecified: Secondary | ICD-10-CM | POA: Diagnosis not present

## 2023-05-21 DIAGNOSIS — E119 Type 2 diabetes mellitus without complications: Secondary | ICD-10-CM | POA: Diagnosis not present

## 2023-05-21 DIAGNOSIS — E785 Hyperlipidemia, unspecified: Secondary | ICD-10-CM | POA: Diagnosis not present

## 2023-05-29 DIAGNOSIS — R300 Dysuria: Secondary | ICD-10-CM | POA: Diagnosis not present

## 2023-05-29 DIAGNOSIS — N3281 Overactive bladder: Secondary | ICD-10-CM | POA: Diagnosis not present

## 2023-07-14 DIAGNOSIS — I2510 Atherosclerotic heart disease of native coronary artery without angina pectoris: Secondary | ICD-10-CM | POA: Diagnosis not present

## 2023-07-14 DIAGNOSIS — Z8249 Family history of ischemic heart disease and other diseases of the circulatory system: Secondary | ICD-10-CM | POA: Diagnosis not present

## 2023-07-14 DIAGNOSIS — R5383 Other fatigue: Secondary | ICD-10-CM | POA: Diagnosis not present

## 2023-07-14 DIAGNOSIS — R0789 Other chest pain: Secondary | ICD-10-CM | POA: Diagnosis not present

## 2023-07-14 DIAGNOSIS — E7800 Pure hypercholesterolemia, unspecified: Secondary | ICD-10-CM | POA: Diagnosis not present

## 2023-07-28 DIAGNOSIS — G4730 Sleep apnea, unspecified: Secondary | ICD-10-CM | POA: Diagnosis not present

## 2023-07-28 DIAGNOSIS — E119 Type 2 diabetes mellitus without complications: Secondary | ICD-10-CM | POA: Diagnosis not present

## 2023-07-28 DIAGNOSIS — E7800 Pure hypercholesterolemia, unspecified: Secondary | ICD-10-CM | POA: Diagnosis not present

## 2023-07-28 DIAGNOSIS — E559 Vitamin D deficiency, unspecified: Secondary | ICD-10-CM | POA: Diagnosis not present

## 2023-07-28 DIAGNOSIS — R0789 Other chest pain: Secondary | ICD-10-CM | POA: Diagnosis not present

## 2023-07-28 DIAGNOSIS — N3281 Overactive bladder: Secondary | ICD-10-CM | POA: Diagnosis not present

## 2023-07-28 DIAGNOSIS — E785 Hyperlipidemia, unspecified: Secondary | ICD-10-CM | POA: Diagnosis not present

## 2023-07-28 DIAGNOSIS — E669 Obesity, unspecified: Secondary | ICD-10-CM | POA: Diagnosis not present

## 2023-07-28 DIAGNOSIS — Z8249 Family history of ischemic heart disease and other diseases of the circulatory system: Secondary | ICD-10-CM | POA: Diagnosis not present

## 2023-07-28 DIAGNOSIS — R5383 Other fatigue: Secondary | ICD-10-CM | POA: Diagnosis not present

## 2023-08-17 DIAGNOSIS — E785 Hyperlipidemia, unspecified: Secondary | ICD-10-CM | POA: Diagnosis not present

## 2023-08-17 DIAGNOSIS — E119 Type 2 diabetes mellitus without complications: Secondary | ICD-10-CM | POA: Diagnosis not present

## 2023-08-17 DIAGNOSIS — E669 Obesity, unspecified: Secondary | ICD-10-CM | POA: Diagnosis not present

## 2023-10-29 DIAGNOSIS — E119 Type 2 diabetes mellitus without complications: Secondary | ICD-10-CM | POA: Diagnosis not present

## 2023-10-29 DIAGNOSIS — E663 Overweight: Secondary | ICD-10-CM | POA: Diagnosis not present

## 2023-10-29 DIAGNOSIS — I2510 Atherosclerotic heart disease of native coronary artery without angina pectoris: Secondary | ICD-10-CM | POA: Diagnosis not present

## 2023-10-29 DIAGNOSIS — E559 Vitamin D deficiency, unspecified: Secondary | ICD-10-CM | POA: Diagnosis not present

## 2023-10-29 DIAGNOSIS — N3281 Overactive bladder: Secondary | ICD-10-CM | POA: Diagnosis not present

## 2023-10-29 DIAGNOSIS — R0602 Shortness of breath: Secondary | ICD-10-CM | POA: Diagnosis not present

## 2023-10-29 DIAGNOSIS — E785 Hyperlipidemia, unspecified: Secondary | ICD-10-CM | POA: Diagnosis not present

## 2023-10-29 DIAGNOSIS — R030 Elevated blood-pressure reading, without diagnosis of hypertension: Secondary | ICD-10-CM | POA: Diagnosis not present

## 2023-10-29 DIAGNOSIS — R002 Palpitations: Secondary | ICD-10-CM | POA: Diagnosis not present

## 2023-10-29 DIAGNOSIS — G4730 Sleep apnea, unspecified: Secondary | ICD-10-CM | POA: Diagnosis not present

## 2023-10-29 DIAGNOSIS — R0789 Other chest pain: Secondary | ICD-10-CM | POA: Diagnosis not present

## 2023-10-29 DIAGNOSIS — R8290 Unspecified abnormal findings in urine: Secondary | ICD-10-CM | POA: Diagnosis not present

## 2023-11-23 DIAGNOSIS — Z87891 Personal history of nicotine dependence: Secondary | ICD-10-CM | POA: Diagnosis not present

## 2023-11-23 DIAGNOSIS — Z0000 Encounter for general adult medical examination without abnormal findings: Secondary | ICD-10-CM | POA: Diagnosis not present

## 2023-11-23 DIAGNOSIS — E663 Overweight: Secondary | ICD-10-CM | POA: Diagnosis not present

## 2023-11-23 DIAGNOSIS — E785 Hyperlipidemia, unspecified: Secondary | ICD-10-CM | POA: Diagnosis not present

## 2023-11-23 DIAGNOSIS — E559 Vitamin D deficiency, unspecified: Secondary | ICD-10-CM | POA: Diagnosis not present

## 2023-11-23 DIAGNOSIS — N3281 Overactive bladder: Secondary | ICD-10-CM | POA: Diagnosis not present

## 2023-11-23 DIAGNOSIS — E119 Type 2 diabetes mellitus without complications: Secondary | ICD-10-CM | POA: Diagnosis not present

## 2024-01-29 DIAGNOSIS — F411 Generalized anxiety disorder: Secondary | ICD-10-CM | POA: Diagnosis not present

## 2024-01-29 DIAGNOSIS — E559 Vitamin D deficiency, unspecified: Secondary | ICD-10-CM | POA: Diagnosis not present

## 2024-01-29 DIAGNOSIS — E119 Type 2 diabetes mellitus without complications: Secondary | ICD-10-CM | POA: Diagnosis not present

## 2024-01-29 DIAGNOSIS — F4000 Agoraphobia, unspecified: Secondary | ICD-10-CM | POA: Diagnosis not present

## 2024-01-29 DIAGNOSIS — G4730 Sleep apnea, unspecified: Secondary | ICD-10-CM | POA: Diagnosis not present

## 2024-01-29 DIAGNOSIS — Z720 Tobacco use: Secondary | ICD-10-CM | POA: Diagnosis not present

## 2024-01-29 DIAGNOSIS — E663 Overweight: Secondary | ICD-10-CM | POA: Diagnosis not present

## 2024-01-29 DIAGNOSIS — Z91199 Patient's noncompliance with other medical treatment and regimen due to unspecified reason: Secondary | ICD-10-CM | POA: Diagnosis not present

## 2024-01-29 DIAGNOSIS — R030 Elevated blood-pressure reading, without diagnosis of hypertension: Secondary | ICD-10-CM | POA: Diagnosis not present

## 2024-01-29 DIAGNOSIS — E785 Hyperlipidemia, unspecified: Secondary | ICD-10-CM | POA: Diagnosis not present

## 2024-02-08 DIAGNOSIS — F411 Generalized anxiety disorder: Secondary | ICD-10-CM | POA: Diagnosis not present

## 2024-02-08 DIAGNOSIS — F332 Major depressive disorder, recurrent severe without psychotic features: Secondary | ICD-10-CM | POA: Diagnosis not present

## 2024-03-09 DIAGNOSIS — F332 Major depressive disorder, recurrent severe without psychotic features: Secondary | ICD-10-CM | POA: Diagnosis not present

## 2024-03-09 DIAGNOSIS — F411 Generalized anxiety disorder: Secondary | ICD-10-CM | POA: Diagnosis not present

## 2024-03-17 DIAGNOSIS — J029 Acute pharyngitis, unspecified: Secondary | ICD-10-CM | POA: Diagnosis not present

## 2024-03-17 DIAGNOSIS — H6993 Unspecified Eustachian tube disorder, bilateral: Secondary | ICD-10-CM | POA: Diagnosis not present

## 2024-03-29 DIAGNOSIS — H6993 Unspecified Eustachian tube disorder, bilateral: Secondary | ICD-10-CM | POA: Diagnosis not present

## 2024-03-29 DIAGNOSIS — E119 Type 2 diabetes mellitus without complications: Secondary | ICD-10-CM | POA: Diagnosis not present

## 2024-04-11 DIAGNOSIS — H5213 Myopia, bilateral: Secondary | ICD-10-CM | POA: Diagnosis not present

## 2024-04-13 DIAGNOSIS — F411 Generalized anxiety disorder: Secondary | ICD-10-CM | POA: Diagnosis not present

## 2024-04-13 DIAGNOSIS — F332 Major depressive disorder, recurrent severe without psychotic features: Secondary | ICD-10-CM | POA: Diagnosis not present

## 2024-04-15 DIAGNOSIS — H9203 Otalgia, bilateral: Secondary | ICD-10-CM | POA: Diagnosis not present

## 2024-04-20 ENCOUNTER — Other Ambulatory Visit: Payer: Self-pay | Admitting: Physician Assistant

## 2024-04-20 DIAGNOSIS — H9203 Otalgia, bilateral: Secondary | ICD-10-CM

## 2024-07-20 DIAGNOSIS — F411 Generalized anxiety disorder: Secondary | ICD-10-CM | POA: Diagnosis not present

## 2024-07-20 DIAGNOSIS — F332 Major depressive disorder, recurrent severe without psychotic features: Secondary | ICD-10-CM | POA: Diagnosis not present

## 2024-10-10 DIAGNOSIS — F332 Major depressive disorder, recurrent severe without psychotic features: Secondary | ICD-10-CM | POA: Diagnosis not present

## 2024-10-10 DIAGNOSIS — F411 Generalized anxiety disorder: Secondary | ICD-10-CM | POA: Diagnosis not present

## 2024-11-21 DIAGNOSIS — N3281 Overactive bladder: Secondary | ICD-10-CM | POA: Diagnosis not present

## 2024-11-21 DIAGNOSIS — R5383 Other fatigue: Secondary | ICD-10-CM | POA: Diagnosis not present

## 2024-11-21 DIAGNOSIS — R350 Frequency of micturition: Secondary | ICD-10-CM | POA: Diagnosis not present

## 2024-11-21 DIAGNOSIS — E559 Vitamin D deficiency, unspecified: Secondary | ICD-10-CM | POA: Diagnosis not present

## 2024-11-21 DIAGNOSIS — E119 Type 2 diabetes mellitus without complications: Secondary | ICD-10-CM | POA: Diagnosis not present

## 2024-11-21 NOTE — Progress Notes (Signed)
 Diabetic foot exam:  Left: Monofilament test: Sensation normal  Pulses: normal and present  Skin: Normal and no erythema, no cyanosis or pallor   Other findings: none Right: Monofilament test: Sensation normal  Pulses: normal and present  Skin: Normal and no erythema, no cyanosis or pallor   Other findings: none Exam performed with shoes and socks removed.

## 2024-12-06 DIAGNOSIS — N3281 Overactive bladder: Secondary | ICD-10-CM | POA: Diagnosis not present

## 2024-12-06 NOTE — Progress Notes (Addendum)
 Subjective   Patient ID:  Paige Villarreal is a 58 y.o.(DOB 1966/11/05) female with a history of the following:   Paige Villarreal presents today with a chief complaint of:     Patient presents with   Follow-up    Discuss bladder meds    Last seen by Dr. Alfonzo for OAB. Notes from the provider were reviewed from the encounter dated 10/29/2023.   Uro History: Stones:Yes  (record reviewed; 2023) UTIs: Yes report since menopause  Related to this are the following factors: Location: bladder Severity: OAB with urgency, frequency, bladder spasm managed with Toviaz . In the past increased to BID and then the last month has increased to TID. Current Pain: 0/10 Timing/Duration: hx since 2024 Modifying Factors: report sugar intake irritate symptoms Associated Signs and Symptoms: listed below  Endorses frequency/urgency Nocturia 3x Endorses leakage, wears 3x pads daily  Tried Kegels-made symptoms worse  Tried bladder training   Denies dysuria, fever, chills Denies GH or flank pain  FH: Denies Prostate, bladder, kidney malignancy   Past Medical History: Past Medical History:  Diagnosis Date   Gestational diabetes (*)    Hyperlipidemia    Overactive bladder    Prediabetes     Past Surgical History: Past Surgical History:  Procedure Laterality Date   Adenoidectomy     Tubal ligation      Medications: Medications Ordered Prior to Encounter[1]  Allergies: Ciprofloxacin , Nitrofuran derivatives, Pravastatin, and Rosuvastatin  Family History: Family History[2]  Social History: Tobacco Use History[3]    Social History   Substance and Sexual Activity  Alcohol Use Never    Social History   Substance and Sexual Activity  Drug Use Never     Family History, and Social History were reviewed and updated as appropriate.  Review of Systems:  Positive Symptoms are BOLDED  CONSTITUTIONAL: Fevers, chills, headache, weight change EYES, NOSE, THROAT: Glaucoma, glasses, sinus  problems CARDIOVASCULAR: Chest pain, varicose veins, high blood pressure. RESPIRATORY: Wheezing, COPD, shortness of breath, sleep apnea. ENDOCRINE: Diabetes, thyroid  disease, other.  MUSCULOSKELETAL: Joint pain, neck pain, back pain. GASTROINTESTINAL: Abdominal pain, constipation, diarrhea, nausea, vomiting, indigestion.  HEMATOLOGY: Easy bleeding, easy bruising, aspirin use within the last 2 weeks  NEUROLOGIC: Tremors, dizzy spells, numbness, tingling. GENITOURINARY: See above PSYCHOLOGIC: Anxiety, depression, other. All systems reviewed and otherwise negative except as above.    Objective   BMI was reviewed and is elevated.  Accompanied by self today  CONSTITUTIONAL: This a 58 y.o. female in no acute distress.   Vitals:   12/06/24 1522  BP: 140/87  Pulse: 84  Temp: 98.3 F (36.8 C)   PSYCHOLOGIC:  Awake, alert and oriented to person, place and time. Normal mood and affect DERMATOLOGIC: Skin warm and dry RESPIRATORY: Respiratory effort normal CARDIOVASCULAR:  No cyanosis GASTROINTESTINAL: Abdomen is non-distended  NEUROLOGIC: No gross motor dysfunction. Does not use a walker or wheelchair to assist ambulation LYMPHATIC: No palpable inguinal lymphadenopathy MUSCULOSKELETAL: Range of motion grossly intact GENITOURINARY:   - Kidneys normal. No CVA tenderness  - Bladder non-tender and not distended.  ECOG PERFORMANCE STATUS: 0 0 Fully active, able to carry on all pre-disease performance without restriction 1 Restricted in physically strenuous activity but ambulatory and able to carry out work of a light or sedentary nature, e.g., light house work, office work 2 Ambulatory and capable of all selfcare but unable to carry out any work activities; up and about more than 50% of waking hours 3 Capable of only limited selfcare; confined to bed or  chair more than 50% of waking hours 4 Completely disabled; cannot carry on any selfcare; totally confined to bed or  chair   Labs/Radiology   Office Visit on 12/06/2024  Component Date Value Ref Range Status   Urine Color 12/06/2024 Yellow   Final   Urine Clarity 12/06/2024 Clear   Final   Urine Glucose 12/06/2024 Negative  Negative mg/dL Final   Urine Bilirubin 12/06/2024 Negative  Negative mg/dL Final   Urine Ketones 87/90/7974 Negative  Negative mg/dl Final   Urine Specific Gravity 12/06/2024 1.020  1.005 - 1.030 Final   Urine pH 12/06/2024 7.5  5.0 - 9.0 Final   Urine Protein - Dipstick 12/06/2024 Negative  Negative mg/dl Final   Urine Urobilinogen 12/06/2024 0.2  0.2 , 1.0 mg/dl Final   Urine Nitrite 87/90/7974 Negative  Negative Final   Urine Leukocyte Esterase 12/06/2024 Negative  Negative Leu/mcL Final   Urine Blood 12/06/2024 Negative  Negative mg/dL Final    Notable Labs reviewed:  Lab Results  Component Value Date   Creatinine 0.79 11/21/2024   No results found for: TESTOSTERONE  No results found for: PSA  Radiology:  No results found.  I have independently viewed the above imaging and agree with the impression as stated.   Bladder Ultrasound Post Void Residuals: Date Volume (mL)            No results found for this or any previous visit.      Assessment and Plan  Paige Villarreal is a 58 y.o. female with the following diagnoses:  1. Overactive bladder    No problems updated.  Our plan is as follows:  Paige Villarreal was seen today for follow-up.  Diagnoses and all orders for this visit:  Overactive bladder -     Urinalysis; Future -     Urinalysis -     trospium (SANCTURA) 20 mg tablet; Take one tablet (20 mg dose) by mouth 2 (two) times daily. -     US  Post Void Residual No Images   Decline PFPT Continue to decrease Bladder irritants including caffeine and sugar intake Trial trospium  Discussed that Toviaz  TID was 3x recommended daily amount and do not recommend she continue at this dosage.   Orders Placed This Encounter  Procedures   US  Post  Void Residual No Images   Urinalysis    Risks, benefits, and alternatives of the medications and treatment plan prescribed today were discussed, and patient expressed understanding.  Patient and or her family expresses understanding and all questions and concerns were answered. The patient is in agreement with the plan as stated above.             [1] Current Outpatient Medications on File Prior to Visit  Medication Sig Dispense Refill   b complex vitamins capsule Take one capsule by mouth daily.     escitalopram  oxalate (LEXAPRO ) 20 mg tablet Take one tablet (20 mg dose) by mouth at bedtime.     semaglutide, 1 mg/dose, (OZEMPIC, 1 MG/DOSE,) 4 mg/3 mL SOPN pen Inject 1 mg into the skin once a week. 3 mL 4   vitamin D3, cholecalciferol, (OPTIMAL-D) 50,000 units CAPS Take one capsule by mouth once a week.     No current facility-administered medications on file prior to visit.  [2] Family History Problem Relation Name Age of Onset   Arthritis Father     Hyperlipidemia Father     Hypertension Father     No Known Problems Mother  Parkinsonism Father    [3] Social History Tobacco Use  Smoking Status Former   Current packs/day: 0.00   Average packs/day: 1 pack/day for 32.0 years (32.0 ttl pk-yrs)   Types: Cigarettes   Start date: 70   Quit date: 2019   Years since quitting: 6.9   Passive exposure: Past  Smokeless Tobacco Never

## 2025-01-15 ENCOUNTER — Encounter (HOSPITAL_BASED_OUTPATIENT_CLINIC_OR_DEPARTMENT_OTHER): Payer: Self-pay

## 2025-01-15 ENCOUNTER — Emergency Department (HOSPITAL_BASED_OUTPATIENT_CLINIC_OR_DEPARTMENT_OTHER)

## 2025-01-15 ENCOUNTER — Observation Stay (HOSPITAL_BASED_OUTPATIENT_CLINIC_OR_DEPARTMENT_OTHER): Admission: EM | Admit: 2025-01-15 | Discharge: 2025-01-17 | Disposition: A | Discharge status: Home / Self Care

## 2025-01-15 ENCOUNTER — Other Ambulatory Visit: Payer: Self-pay

## 2025-01-15 DIAGNOSIS — K8012 Calculus of gallbladder with acute and chronic cholecystitis without obstruction: Secondary | ICD-10-CM | POA: Diagnosis not present

## 2025-01-15 DIAGNOSIS — Z87891 Personal history of nicotine dependence: Secondary | ICD-10-CM | POA: Insufficient documentation

## 2025-01-15 DIAGNOSIS — E119 Type 2 diabetes mellitus without complications: Secondary | ICD-10-CM | POA: Diagnosis not present

## 2025-01-15 DIAGNOSIS — R109 Unspecified abdominal pain: Secondary | ICD-10-CM | POA: Diagnosis present

## 2025-01-15 DIAGNOSIS — K810 Acute cholecystitis: Principal | ICD-10-CM | POA: Diagnosis present

## 2025-01-15 HISTORY — DX: Type 2 diabetes mellitus without complications: E11.9

## 2025-01-15 LAB — COMPREHENSIVE METABOLIC PANEL WITH GFR
ALT: 1493 U/L — ABNORMAL HIGH (ref 0–44)
AST: 1393 U/L — ABNORMAL HIGH (ref 15–41)
Albumin: 4.6 g/dL (ref 3.5–5.0)
Alkaline Phosphatase: 209 U/L — ABNORMAL HIGH (ref 38–126)
Anion gap: 14 (ref 5–15)
BUN: 7 mg/dL (ref 6–20)
CO2: 22 mmol/L (ref 22–32)
Calcium: 9.4 mg/dL (ref 8.9–10.3)
Chloride: 101 mmol/L (ref 98–111)
Creatinine, Ser: 0.65 mg/dL (ref 0.44–1.00)
GFR, Estimated: >60 mL/min
Glucose, Bld: 124 mg/dL — ABNORMAL HIGH (ref 70–99)
Potassium: 3.7 mmol/L (ref 3.5–5.1)
Sodium: 138 mmol/L (ref 135–145)
Total Bilirubin: 2.7 mg/dL — ABNORMAL HIGH (ref 0.0–1.2)
Total Protein: 7.2 g/dL (ref 6.5–8.1)

## 2025-01-15 LAB — URINALYSIS, ROUTINE W REFLEX MICROSCOPIC
Bacteria, UA: NONE SEEN
Bilirubin Urine: NEGATIVE
Glucose, UA: NEGATIVE mg/dL
Hgb urine dipstick: NEGATIVE
Ketones, ur: 15 mg/dL — AB
Leukocytes,Ua: NEGATIVE
Nitrite: NEGATIVE
Protein, ur: 30 mg/dL — AB
Specific Gravity, Urine: 1.014 (ref 1.005–1.030)
pH: 6.5 (ref 5.0–8.0)

## 2025-01-15 LAB — CBC
HCT: 41.2 % (ref 36.0–46.0)
Hemoglobin: 14.3 g/dL (ref 12.0–15.0)
MCH: 30.5 pg (ref 26.0–34.0)
MCHC: 34.7 g/dL (ref 30.0–36.0)
MCV: 87.8 fL (ref 80.0–100.0)
Platelets: 299 K/uL (ref 150–400)
RBC: 4.69 MIL/uL (ref 3.87–5.11)
RDW: 12.5 % (ref 11.5–15.5)
WBC: 5.2 K/uL (ref 4.0–10.5)
nRBC: 0 % (ref 0.0–0.2)

## 2025-01-15 LAB — LIPASE, BLOOD: Lipase: 44 U/L (ref 11–51)

## 2025-01-15 MED ORDER — ONDANSETRON HCL 4 MG/2ML IJ SOLN: 4.0000 mg | Freq: Four times a day (QID) | INTRAMUSCULAR | Status: DC | PRN

## 2025-01-15 MED ORDER — KETOROLAC TROMETHAMINE 15 MG/ML IJ SOLN
15.0000 mg | Freq: Three times a day (TID) | INTRAMUSCULAR | Status: DC
  Administered 2025-01-15 – 2025-01-17 (×4): 15 mg via INTRAVENOUS
  Filled 2025-01-15 (×4): qty 1

## 2025-01-15 MED ORDER — PROCHLORPERAZINE EDISYLATE 10 MG/2ML IJ SOLN
10.0000 mg | INTRAMUSCULAR | Status: DC | PRN
  Filled 2025-01-15: qty 2

## 2025-01-15 MED ORDER — HYDROMORPHONE HCL 1 MG/ML IJ SOLN
0.5000 mg | Freq: Once | INTRAMUSCULAR | Status: AC
  Administered 2025-01-15: 0.5 mg via INTRAVENOUS
  Filled 2025-01-15: qty 1

## 2025-01-15 MED ORDER — OXYCODONE HCL 5 MG PO TABS: 5.0000 mg | ORAL_TABLET | ORAL | Status: DC | PRN

## 2025-01-15 MED ORDER — METOCLOPRAMIDE HCL 5 MG/ML IJ SOLN
5.0000 mg | Freq: Once | INTRAMUSCULAR | Status: AC
  Administered 2025-01-15: 5 mg via INTRAVENOUS
  Filled 2025-01-15: qty 2

## 2025-01-15 MED ORDER — ENOXAPARIN SODIUM 40 MG/0.4ML IJ SOSY
40.0000 mg | PREFILLED_SYRINGE | INTRAMUSCULAR | Status: DC
  Administered 2025-01-16 – 2025-01-17 (×2): 40 mg via SUBCUTANEOUS
  Filled 2025-01-15 (×2): qty 0.4

## 2025-01-15 MED ORDER — SIMETHICONE 80 MG PO CHEW: 80.0000 mg | CHEWABLE_TABLET | Freq: Four times a day (QID) | ORAL | Status: DC | PRN

## 2025-01-15 MED ORDER — METHOCARBAMOL 1000 MG/10ML IJ SOLN: 500.0000 mg | Freq: Four times a day (QID) | INTRAMUSCULAR | Status: DC | PRN

## 2025-01-15 MED ORDER — GABAPENTIN 300 MG PO CAPS
300.0000 mg | ORAL_CAPSULE | Freq: Three times a day (TID) | ORAL | Status: DC
  Administered 2025-01-15 – 2025-01-16 (×2): 300 mg via ORAL
  Filled 2025-01-15 (×2): qty 1

## 2025-01-15 MED ORDER — ONDANSETRON HCL 4 MG/2ML IJ SOLN
4.0000 mg | Freq: Once | INTRAMUSCULAR | Status: AC | PRN
  Administered 2025-01-15: 4 mg via INTRAVENOUS
  Filled 2025-01-15: qty 2

## 2025-01-15 MED ORDER — HYDROMORPHONE HCL 1 MG/ML IJ SOLN
1.0000 mg | INTRAMUSCULAR | Status: DC | PRN
  Administered 2025-01-16 (×2): 1 mg via INTRAVENOUS
  Filled 2025-01-15 (×2): qty 1

## 2025-01-15 MED ORDER — ACETAMINOPHEN 325 MG PO TABS
650.0000 mg | ORAL_TABLET | Freq: Four times a day (QID) | ORAL | Status: DC
  Administered 2025-01-15 – 2025-01-17 (×6): 650 mg via ORAL
  Filled 2025-01-15 (×5): qty 2

## 2025-01-15 MED ORDER — OXYCODONE HCL 5 MG PO TABS: 10.0000 mg | ORAL_TABLET | ORAL | Status: DC | PRN

## 2025-01-15 NOTE — ED Triage Notes (Signed)
 Pt reports generalized abd pain starting last PM. Pt reports N/V.

## 2025-01-15 NOTE — ED Notes (Signed)
 Chart marked as green ready

## 2025-01-15 NOTE — ED Notes (Signed)
 [facility] at bedside

## 2025-01-15 NOTE — ED Notes (Signed)
 Kim with cl called for transport

## 2025-01-15 NOTE — ED Provider Notes (Signed)
 " [place] EMERGENCY DEPARTMENT AT [facility] Provider Note   CSN: [id number] Arrival date & time: 01/15/25  1609     Patient presents with: Abdominal Pain   Paige Villarreal is a 59 y.o. female.  With pertinent medical history of overactive bladder, stress urinary incontinence, anxiety, perimenopause, vitamin D  deficiency.   Patient is here for evaluation of epigastric abdominal pain and nausea that began yesterday evening.  Patient is accompanied by her son today.  She has not eaten secondary to nausea.  She has had one episode of emesis while on the way to ED today.  Denies alcohol use or history of heavy alcohol use.  She has never experienced pain like this in the past.  Reports pain extends into her back.  Past abdominal surgical history includes tubal ligation.  Patient taking Ozempic and has been for the past 2 years with no recent increase in dosing; she takes medication on Wednesdays.  Denies dysuria or hematuria.  Last bowel movement was yesterday.  Denies recorded fevers though she has felt hot today.  She is vegetarian.  The history is provided by the patient and a relative.  Abdominal Pain Pain location:  Epigastric      Prior to Admission medications  Medication Sig Start Date End Date Taking? Authorizing Provider  5-Hydroxytryptophan (5-HTP PO) Take 1 capsule by mouth daily.    [provider]  Choline-Inositol-Methionine (LIPO) 50-50-25 MG/ML SOLN Inject 1 Syringe into the muscle once a week.    [provider]  CRANBERRY PO Take 2-4 tablets by mouth daily as needed (uti).    [provider]  Cyanocobalamin  (VITAMIN B-12 IJ) Inject 1 Syringe as directed every 30 (thirty) days.    [provider]  diphenhydrAMINE  (BENADRYL ) 25 MG tablet Take 25 mg by mouth every 6 (six) hours as needed (hives, itching, swelling.).    [provider]  famotidine  (PEPCID ) 20 MG tablet Take 1 tablet (20 mg total) by mouth daily for 5 days.  03/16/20 03/21/20  Tegeler, Lonni PARAS, MD  FLUoxetine  (PROZAC ) 10 MG tablet Take one tablet daily Patient taking differently: Take 10 mg by mouth daily.  08/09/14   Neysa Inocente PARAS, NP  human chorionic gonadotropin (PREGNYL/NOVAREL) 10000 units injection Inject 10,000 Units into the muscle once.    [provider]  hydrOXYzine (ATARAX/VISTARIL) 25 MG tablet Take 25-50 mg by mouth See admin instructions. Every 6-8 hours as needed for itching.    [provider]  methocarbamol  (ROBAXIN ) 500 MG tablet Take 1,000 mg by mouth 3 (three) times daily. 06/25/22   [provider]  predniSONE  (STERAPRED UNI-PAK 21 TAB) 10 MG (21) TBPK tablet Take by mouth daily. Take 6 tabs by mouth daily  for 2 days, then 5 tabs for 2 days, then 4 tabs for 2 days, then 3 tabs for 2 days, 2 tabs for 2 days, then 1 tab by mouth daily for 2 days 06/27/22   Logan Martinis B, PA-C  tiZANidine  (ZANAFLEX ) 4 MG tablet Take 0.5-1 tablets (2-4 mg total) by mouth every 6 (six) hours as needed for muscle spasms. Patient not taking: Reported on 06/27/2022 05/02/21   Arloa Suzen RAMAN, NP  TURMERIC CURCUMIN PO Take 15 mLs by mouth daily.    [provider]    Allergies: Ciprofloxacin , Nitrofuran derivatives, Oxybutynin, and Other    Review of Systems  Gastrointestinal:  Positive for abdominal pain.   Vitals:   01/15/25 1615 01/15/25 1616 01/15/25 2354  BP:  ROLLEN)  176/95 138/83  Pulse:  70 72  Resp:  17 20  Temp:  98.9 F (37.2 C) 98.1 F (36.7 C)  TempSrc:   Oral  SpO2:  97% 98%  Weight: 61.2 kg    Height: 5' 1 (1.549 m)      Updated Vital Signs BP 138/83 (BP Location: Left Arm)   Pulse 72   Temp 98.1 F (36.7 C) (Oral)   Resp 20   Ht 5' 1 (1.549 m)   Wt 61.2 kg   LMP 08/06/2014   SpO2 98%   BMI 25.51 kg/m   Physical Exam Vitals and nursing note reviewed.  Constitutional:      General: She is in acute distress (Patient bent forward and holding abdomen secondary to pain.).      Appearance: Normal appearance. She is well-developed and normal weight. She is not ill-appearing, toxic-appearing or diaphoretic.  HENT:     Head: Normocephalic and atraumatic.     Nose: Nose normal.     Mouth/Throat:     Mouth: Mucous membranes are moist.  Eyes:     General: No scleral icterus.    Extraocular Movements: Extraocular movements intact.     Conjunctiva/sclera: Conjunctivae normal.  Cardiovascular:     Rate and Rhythm: Normal rate and regular rhythm.     Pulses: Normal pulses.     Heart sounds: Normal heart sounds.  Pulmonary:     Effort: Pulmonary effort is normal. No respiratory distress.     Breath sounds: Normal breath sounds. No stridor. No wheezing, rhonchi or rales.  Abdominal:     General: Abdomen is flat. There is no distension. There are no signs of injury.     Palpations: Abdomen is soft. There is no mass or pulsatile mass.     Tenderness: There is abdominal tenderness (Patient reports increase in epigastric pain with deep palpation of the right upper quadrant.) in the right upper quadrant and epigastric area. There is guarding. There is no right CVA tenderness or left CVA tenderness. Negative signs include Rovsing's sign and McBurney's sign.  Musculoskeletal:     Cervical back: Normal range of motion.  Skin:    General: Skin is warm and dry.     Capillary Refill: Capillary refill takes less than 2 seconds.     Coloration: Skin is not jaundiced or pale.     Comments: Skin feels warm to touch  Neurological:     Mental Status: She is alert and oriented to person, place, and time.     (all labs ordered are listed, but only abnormal results are displayed) Labs Reviewed  COMPREHENSIVE METABOLIC PANEL WITH GFR - Abnormal; Notable for the following components:      Result Value   Glucose, Bld 124 (*)    AST 1,393 (*)    ALT 1,493 (*)    Alkaline Phosphatase 209 (*)    Total Bilirubin 2.7 (*)    All other components within normal limits  URINALYSIS, ROUTINE  W REFLEX MICROSCOPIC - Abnormal; Notable for the following components:   Ketones, ur 15 (*)    Protein, ur 30 (*)    All other components within normal limits  LIPASE, BLOOD  CBC  CBC  CREATININE, SERUM  HIV ANTIBODY (ROUTINE TESTING W REFLEX)  BASIC METABOLIC PANEL WITH GFR  CBC  HEPATIC FUNCTION PANEL    EKG: None  Radiology: US  Abdomen Limited RUQ (LIVER/GB) Result Date: 01/15/2025 EXAM: Right Upper Quadrant Abdominal Ultrasound 01/15/2025 06:03:00 PM TECHNIQUE: Real-time ultrasonography  of the right upper quadrant of the abdomen was performed. COMPARISON: Prior study dated 07/04/2022. CLINICAL HISTORY: RUQ pain. FINDINGS: LIVER: Normal echogenicity. No intrahepatic biliary ductal dilatation. No evidence of mass. Hepatopetal flow in the portal vein. BILIARY SYSTEM: Multiple layering stones are present within the gallbladder, measuring up to 3 mm. The gallbladder wall is thickened to 7 mm with sludge and pericholecystic fluid. The patient was tender over the gallbladder during the study. Findings compatible with acute cholecystitis. Common bile duct normal caliber at 3 mm. RIGHT KIDNEY: No hydronephrosis. No echogenic calculi. No mass. PANCREAS: Visualized portions of the pancreas are unremarkable. OTHER: No right upper quadrant ascites. IMPRESSION: 1. Cholelithiasis with findings compatible with acute cholecystitis. Electronically signed by: Franky Crease MD 01/15/2025 06:16 PM EST RP Workstation: [id number]     Procedures   Medications Ordered in the ED  enoxaparin  (LOVENOX ) injection 40 mg (has no administration in time range)  acetaminophen  (TYLENOL ) tablet 650 mg (650 mg Oral Given 01/15/25 2316)  ketorolac  (TORADOL ) 15 MG/ML injection 15 mg (15 mg Intravenous Given 01/15/25 2317)  gabapentin  (NEURONTIN ) capsule 300 mg (300 mg Oral Given 01/15/25 2317)  oxyCODONE  (Oxy IR/ROXICODONE ) immediate release tablet 5 mg (has no administration in time range)  oxyCODONE  (Oxy IR/ROXICODONE )  immediate release tablet 10 mg (has no administration in time range)  HYDROmorphone  (DILAUDID ) injection 1 mg (has no administration in time range)  methocarbamol  (ROBAXIN ) injection 500 mg (has no administration in time range)  ondansetron  (ZOFRAN ) injection 4 mg (has no administration in time range)  prochlorperazine  (COMPAZINE ) injection 10 mg (has no administration in time range)  simethicone  (MYLICON) chewable tablet 80 mg (has no administration in time range)  ondansetron  (ZOFRAN ) injection 4 mg (4 mg Intravenous Given 01/15/25 1620)  HYDROmorphone  (DILAUDID ) injection 0.5 mg (0.5 mg Intravenous Given 01/15/25 1732)  metoCLOPramide  (REGLAN ) injection 5 mg (5 mg Intravenous Given 01/15/25 1732)  HYDROmorphone  (DILAUDID ) injection 0.5 mg (0.5 mg Intravenous Given 01/15/25 1809)  HYDROmorphone  (DILAUDID ) injection 0.5 mg (0.5 mg Intravenous Given 01/15/25 1945)    Patient presents to the ED for concern of epigastric abdominal pain, this involves an extensive number of treatment options, and is a complaint that carries with it a high risk of complications and morbidity.  The differential diagnosis includes pancreatitis, biliary disease, peptic ulcer disease, ACS, gastritis, hepatitis, malignancy, GERD, AAA, UTI.   Additional history obtained:  Additional history obtained from Family and Outside Medical Records   External records from outside source obtained and reviewed including additional history provided by son at bedside and review of previous lab work for comparison.   Lab Tests:  I Ordered, and personally interpreted labs.  The pertinent results include: Elevated LFTs and total bilirubin of 2.7 (Normal LFTs and total bilirubin as of 2 years ago).  Afebrile and no leukocytosis.  Normal lipase.   Imaging Studies ordered:  I ordered imaging studies including RUQ ultrasound I independently visualized and interpreted imaging which showed cholelithiasis with findings compatible with acute  cholecystitis. I agree with the radiologist interpretation   Cardiac Monitoring:  The patient was maintained on a cardiac monitor.  I personally viewed and interpreted the cardiac monitored which showed an underlying rhythm of: Sinus rhythm with no evidence of STEMI   Medicines ordered and prescription drug management:  I ordered medication including Zofran , Reglan , Dilaudid  for nausea and abdominal pain.  She Reevaluation of the patient after these medicines showed that the patient improved.  Vitals improved with management of pain. I have reviewed  the patients home medicines and have made adjustments as needed   Consultations Obtained:  I requested consultation with the general surgery Nada Dawn MD),  and discussed lab and imaging findings as well as pertinent plan - they recommend: Direct admission to [facility] for ongoing care and management of acute cholecystitis.   Problem List / ED Course:  Clinical Course as of 01/16/25 0111  [place] Jan 15, 2025  1903 Patient informed of general surgeon recommendation for admission.  Patient's pain and nausea is well controlled at this time.  Patient is interested in trying oral intake as she has not eaten since yesterday.  She is vegetarian. [EA]    Clinical Course User Index [EA] Rosina Almarie LABOR, PA-C   History is not suspicious for peptic ulcer disease, gastritis, foodborne illness, GERD, AAA.  EKG normal lowering suspicion for ACS.  Urinalysis is not indicative of infection.  Physical exam and transaminitis seen on blood work is suspicious for biliary obstruction and confirmed with right upper quadrant ultrasound showing cholelithiasis and findings compatible with acute cholecystitis.  Acute cholecystitis.  Afebrile with no white count.  Transaminitis noted with total bilirubin of 2.7.  Pain management and antiemetics given.  Right upper quadrant ultrasound confirms cholelithiasis with findings compatible with acute cholecystitis.  General  surgery consulted and recommended direct admission.   Reevaluation:  After the interventions noted above, I reevaluated the patient and found that they have :improved   Social Determinants of Health:  Medicaid use   Dispostion:  Admission to general surgery for ongoing management and treatment of acute cholecystitis.  Clinical Course as of 01/16/25 0111  [place] Jan 15, 2025  1903 Patient informed of general surgeon recommendation for admission.  Patient's pain and nausea is well controlled at this time.  Patient is interested in trying oral intake as she has not eaten since yesterday.  She is vegetarian. [EA]    Clinical Course User Index [EA] Rosina Almarie LABOR, PA-C                                 Medical Decision Making Amount and/or Complexity of Data Reviewed Labs: ordered. Radiology: ordered.  Risk Prescription drug management. Decision regarding hospitalization.   This note was produced using Electronics Engineer. While the provider has reviewed and verified all clinical information, transcription errors may remain.    Final diagnoses:  Acute cholecystitis    ED Discharge Orders     None          Rosina Almarie LABOR, PA-C 01/16/25 9883    Bari Roxie HERO, DO 01/16/25 2320  "

## 2025-01-16 ENCOUNTER — Inpatient Hospital Stay (HOSPITAL_COMMUNITY): Admitting: Anesthesiology

## 2025-01-16 ENCOUNTER — Encounter (HOSPITAL_COMMUNITY)
Admission: EM | Disposition: A | Discharge status: Home / Self Care | Payer: Self-pay | Source: Home / Self Care | Attending: Emergency Medicine

## 2025-01-16 ENCOUNTER — Encounter (HOSPITAL_COMMUNITY): Payer: Self-pay

## 2025-01-16 ENCOUNTER — Inpatient Hospital Stay (HOSPITAL_COMMUNITY)

## 2025-01-16 DIAGNOSIS — K819 Cholecystitis, unspecified: Secondary | ICD-10-CM

## 2025-01-16 HISTORY — PX: CHOLECYSTECTOMY: SHX55

## 2025-01-16 HISTORY — PX: INDOCYANINE GREEN FLUORESCENCE IMAGING (ICG): SHX7595

## 2025-01-16 LAB — HEPATIC FUNCTION PANEL
ALT: 982 U/L — ABNORMAL HIGH (ref 0–44)
AST: 560 U/L — ABNORMAL HIGH (ref 15–41)
Albumin: 4.1 g/dL (ref 3.5–5.0)
Alkaline Phosphatase: 181 U/L — ABNORMAL HIGH (ref 38–126)
Bilirubin, Direct: 0.9 mg/dL — ABNORMAL HIGH (ref 0.0–0.2)
Indirect Bilirubin: 1.6 mg/dL — ABNORMAL HIGH (ref 0.3–0.9)
Total Bilirubin: 2.5 mg/dL — ABNORMAL HIGH (ref 0.0–1.2)
Total Protein: 6.3 g/dL — ABNORMAL LOW (ref 6.5–8.1)

## 2025-01-16 LAB — GLUCOSE, CAPILLARY
Glucose-Capillary: 80 mg/dL (ref 70–99)
Glucose-Capillary: 101 mg/dL — ABNORMAL HIGH (ref 70–99)
Glucose-Capillary: 100 mg/dL — ABNORMAL HIGH (ref 70–99)
Glucose-Capillary: 160 mg/dL — ABNORMAL HIGH (ref 70–99)

## 2025-01-16 LAB — BASIC METABOLIC PANEL WITH GFR
Anion gap: 9 (ref 5–15)
BUN: 6 mg/dL (ref 6–20)
CO2: 27 mmol/L (ref 22–32)
Calcium: 8.9 mg/dL (ref 8.9–10.3)
Chloride: 104 mmol/L (ref 98–111)
Creatinine, Ser: 0.72 mg/dL (ref 0.44–1.00)
GFR, Estimated: >60 mL/min
Glucose, Bld: 93 mg/dL (ref 70–99)
Potassium: 4 mmol/L (ref 3.5–5.1)
Sodium: 139 mmol/L (ref 135–145)

## 2025-01-16 LAB — CBC
HCT: 37.8 % (ref 36.0–46.0)
HCT: 39.3 % (ref 36.0–46.0)
Hemoglobin: 12.9 g/dL (ref 12.0–15.0)
Hemoglobin: 13.4 g/dL (ref 12.0–15.0)
MCH: 30.7 pg (ref 26.0–34.0)
MCH: 30.8 pg (ref 26.0–34.0)
MCHC: 34.1 g/dL (ref 30.0–36.0)
MCHC: 34.1 g/dL (ref 30.0–36.0)
MCV: 90.1 fL (ref 80.0–100.0)
MCV: 90.2 fL (ref 80.0–100.0)
Platelets: 259 K/uL (ref 150–400)
Platelets: 286 K/uL (ref 150–400)
RBC: 4.19 MIL/uL (ref 3.87–5.11)
RBC: 4.36 MIL/uL (ref 3.87–5.11)
RDW: 12.2 % (ref 11.5–15.5)
RDW: 12.5 % (ref 11.5–15.5)
WBC: 6.2 K/uL (ref 4.0–10.5)
WBC: 6.5 K/uL (ref 4.0–10.5)
nRBC: 0 % (ref 0.0–0.2)
nRBC: 0 % (ref 0.0–0.2)

## 2025-01-16 LAB — HIV ANTIBODY (ROUTINE TESTING W REFLEX): HIV Screen 4th Generation wRfx: NONREACTIVE

## 2025-01-16 LAB — CREATININE, SERUM
Creatinine, Ser: 0.68 mg/dL (ref 0.44–1.00)
GFR, Estimated: >60 mL/min

## 2025-01-16 LAB — HEMOGLOBIN A1C
Hgb A1c MFr Bld: 5.2 % (ref 4.8–5.6)
Mean Plasma Glucose: 102.54 mg/dL

## 2025-01-16 MED ORDER — EPHEDRINE SULFATE-NACL 50-0.9 MG/10ML-% IV SOSY
PREFILLED_SYRINGE | INTRAVENOUS | Status: DC | PRN
  Administered 2025-01-16 (×2): 5 mg via INTRAVENOUS

## 2025-01-16 MED ORDER — MIDAZOLAM HCL 2 MG/2ML IJ SOLN
INTRAMUSCULAR | Status: AC
  Filled 2025-01-16: qty 2

## 2025-01-16 MED ORDER — SODIUM CHLORIDE 0.9 % IV SOLN
INTRAVENOUS | Status: AC
  Filled 2025-01-16: qty 20

## 2025-01-16 MED ORDER — DEXAMETHASONE SOD PHOSPHATE PF 10 MG/ML IJ SOLN
INTRAMUSCULAR | Status: DC | PRN
  Administered 2025-01-16: 10 mg via INTRAVENOUS

## 2025-01-16 MED ORDER — 0.9 % SODIUM CHLORIDE (POUR BTL) OPTIME
TOPICAL | Status: DC | PRN
  Administered 2025-01-16: 1000 mL

## 2025-01-16 MED ORDER — OXYCODONE HCL 5 MG PO TABS
ORAL_TABLET | ORAL | Status: AC
  Filled 2025-01-16: qty 1

## 2025-01-16 MED ORDER — LIDOCAINE 2% (20 MG/ML) 5 ML SYRINGE
INTRAMUSCULAR | Status: DC | PRN
  Administered 2025-01-16: 60 mg via INTRAVENOUS

## 2025-01-16 MED ORDER — FENTANYL CITRATE (PF) 100 MCG/2ML IJ SOLN
25.0000 ug | INTRAMUSCULAR | Status: DC | PRN
  Administered 2025-01-16: 25 ug via INTRAVENOUS

## 2025-01-16 MED ORDER — LABETALOL HCL 5 MG/ML IV SOLN
INTRAVENOUS | Status: DC | PRN
  Administered 2025-01-16 (×2): 5 mg via INTRAVENOUS

## 2025-01-16 MED ORDER — BUPIVACAINE-EPINEPHRINE (PF) 0.25% -1:200000 IJ SOLN
INTRAMUSCULAR | Status: AC
  Filled 2025-01-16: qty 30

## 2025-01-16 MED ORDER — INSULIN ASPART 100 UNIT/ML IJ SOLN
0.0000 [IU] | Freq: Three times a day (TID) | INTRAMUSCULAR | Status: DC
  Administered 2025-01-17: 3 [IU] via SUBCUTANEOUS
  Filled 2025-01-16: qty 3

## 2025-01-16 MED ORDER — INDOCYANINE GREEN 25 MG IJ SOLR
1.2500 mg | Freq: Once | INTRAMUSCULAR | Status: AC
  Administered 2025-01-16: 1.25 mg via INTRAVENOUS
  Filled 2025-01-16: qty 10

## 2025-01-16 MED ORDER — ONDANSETRON HCL 4 MG/2ML IJ SOLN
INTRAMUSCULAR | Status: DC | PRN
  Administered 2025-01-16: 4 mg via INTRAVENOUS

## 2025-01-16 MED ORDER — DROPERIDOL 2.5 MG/ML IJ SOLN
0.6250 mg | Freq: Once | INTRAMUSCULAR | Status: AC | PRN
  Administered 2025-01-16: 0.625 mg via INTRAVENOUS

## 2025-01-16 MED ORDER — INSULIN ASPART 100 UNIT/ML IJ SOLN: 0.0000 [IU] | INTRAMUSCULAR | Status: DC | PRN

## 2025-01-16 MED ORDER — FENTANYL CITRATE (PF) 100 MCG/2ML IJ SOLN
INTRAMUSCULAR | Status: AC
  Filled 2025-01-16: qty 2

## 2025-01-16 MED ORDER — SODIUM CHLORIDE 0.9 % IV SOLN
2.0000 g | INTRAVENOUS | Status: AC
  Administered 2025-01-16: 2 g via INTRAVENOUS
  Filled 2025-01-16: qty 20

## 2025-01-16 MED ORDER — OXYCODONE HCL 5 MG PO TABS
5.0000 mg | ORAL_TABLET | Freq: Once | ORAL | Status: AC | PRN
  Administered 2025-01-16: 5 mg via ORAL

## 2025-01-16 MED ORDER — FESOTERODINE FUMARATE ER 4 MG PO TB24
4.0000 mg | ORAL_TABLET | Freq: Every day | ORAL | Status: DC
  Filled 2025-01-16 (×2): qty 1

## 2025-01-16 MED ORDER — ORAL CARE MOUTH RINSE: 15.0000 mL | Freq: Once | OROMUCOSAL | Status: AC

## 2025-01-16 MED ORDER — MIDAZOLAM HCL (PF) 2 MG/2ML IJ SOLN
INTRAMUSCULAR | Status: DC | PRN
  Administered 2025-01-16: 2 mg via INTRAVENOUS

## 2025-01-16 MED ORDER — FENTANYL CITRATE (PF) 250 MCG/5ML IJ SOLN
INTRAMUSCULAR | Status: DC | PRN
  Administered 2025-01-16: 50 ug via INTRAVENOUS
  Administered 2025-01-16 (×2): 100 ug via INTRAVENOUS

## 2025-01-16 MED ORDER — SUGAMMADEX SODIUM 200 MG/2ML IV SOLN
INTRAVENOUS | Status: DC | PRN
  Administered 2025-01-16: 130 mg via INTRAVENOUS

## 2025-01-16 MED ORDER — FENTANYL CITRATE (PF) 250 MCG/5ML IJ SOLN
INTRAMUSCULAR | Status: AC
  Filled 2025-01-16: qty 5

## 2025-01-16 MED ORDER — DROPERIDOL 2.5 MG/ML IJ SOLN
INTRAMUSCULAR | Status: AC
  Filled 2025-01-16: qty 2

## 2025-01-16 MED ORDER — SODIUM CHLORIDE 0.9 % IV SOLN: INTRAVENOUS | Status: DC | PRN

## 2025-01-16 MED ORDER — OXYCODONE HCL 5 MG/5ML PO SOLN: 5.0000 mg | Freq: Once | ORAL | Status: AC | PRN

## 2025-01-16 MED ORDER — BUPIVACAINE-EPINEPHRINE 0.25% -1:200000 IJ SOLN
INTRAMUSCULAR | Status: DC | PRN
  Administered 2025-01-16: 30 mL

## 2025-01-16 MED ORDER — LACTATED RINGERS IV SOLN: INTRAVENOUS | Status: DC

## 2025-01-16 MED ORDER — PROPOFOL 10 MG/ML IV BOLUS
INTRAVENOUS | Status: DC | PRN
  Administered 2025-01-16: 120 mg via INTRAVENOUS

## 2025-01-16 MED ORDER — ROCURONIUM BROMIDE 10 MG/ML (PF) SYRINGE
PREFILLED_SYRINGE | INTRAVENOUS | Status: DC | PRN
  Administered 2025-01-16: 50 mg via INTRAVENOUS

## 2025-01-16 MED ORDER — CHLORHEXIDINE GLUCONATE 0.12 % MT SOLN: 15.0000 mL | Freq: Once | OROMUCOSAL | Status: AC

## 2025-01-16 MED ORDER — SODIUM CHLORIDE 0.9 % IR SOLN
Status: DC | PRN
  Administered 2025-01-16: 1000 mL

## 2025-01-16 MED ORDER — CHLORHEXIDINE GLUCONATE 0.12 % MT SOLN
OROMUCOSAL | Status: AC
  Administered 2025-01-16: 15 mL via OROMUCOSAL
  Filled 2025-01-16: qty 15

## 2025-01-16 MED ORDER — ESCITALOPRAM OXALATE 10 MG PO TABS
20.0000 mg | ORAL_TABLET | Freq: Every day | ORAL | Status: DC
  Administered 2025-01-16: 20 mg via ORAL
  Filled 2025-01-16: qty 2

## 2025-01-16 NOTE — Anesthesia Preprocedure Evaluation (Signed)
 "                                  Anesthesia Evaluation  Patient identified by MRN, date of birth, ID band Patient awake    Reviewed: Allergy & Precautions, NPO status , Patient's Chart, lab work & pertinent test results  History of Anesthesia Complications Negative for: history of anesthetic complications  Airway Mallampati: II  TM Distance: >3 FB Neck ROM: Full    Dental  (+) Teeth Intact, Dental Advisory Given   Pulmonary neg shortness of breath, neg sleep apnea, neg COPD, neg recent URI, former smoker   breath sounds clear to auscultation       Cardiovascular negative cardio ROS  Rhythm:Regular     Neuro/Psych neg Seizures PSYCHIATRIC DISORDERS Anxiety        GI/Hepatic PUD,,,(+)     Substance abuse: .lastb.  CHOLESYSTITIS   Endo/Other  diabetes    Renal/GU negative Renal ROSLab Results      Component                Value               Date                      NA                       139                 01/16/2025                K                        4.0                 01/16/2025                CO2                      27                  01/16/2025                GLUCOSE                  93                  01/16/2025                BUN                      6                   01/16/2025                CREATININE               0.72                01/16/2025                CALCIUM                  8.9                 01/16/2025  GFRNONAA                 >60                 01/16/2025                Musculoskeletal negative musculoskeletal ROS (+)    Abdominal   Peds  Hematology negative hematology ROS (+) Lab Results      Component                Value               Date                      WBC                      6.2                 01/16/2025                HGB                      12.9                01/16/2025                HCT                      37.8                01/16/2025                MCV                       90.2                01/16/2025                PLT                      259                 01/16/2025              Anesthesia Other Findings   Reproductive/Obstetrics                              Anesthesia Physical Anesthesia Plan  ASA: 2  Anesthesia Plan: General   Post-op Pain Management:    Induction: Intravenous  PONV Risk Score and Plan: 4 or greater and Ondansetron , Dexamethasone  and Midazolam   Airway Management Planned: Oral ETT  Additional Equipment: None  Intra-op Plan:   Post-operative Plan: Extubation in OR  Informed Consent: I have reviewed the patients History and Physical, chart, labs and discussed the procedure including the risks, benefits and alternatives for the proposed anesthesia with the patient or authorized representative who has indicated his/her understanding and acceptance.     Dental advisory given  Plan Discussed with: CRNA  Anesthesia Plan Comments:         Anesthesia Quick Evaluation  "

## 2025-01-16 NOTE — Transfer of Care (Signed)
 Immediate Anesthesia Transfer of Care Note  Patient: Paige Villarreal  Procedure(s) Performed: LAPAROSCOPIC CHOLECYSTECTOMY (Abdomen) INDOCYANINE GREEN  FLUORESCENCE IMAGING (ICG) (Abdomen)  Patient Location: PACU  Anesthesia Type:General  Level of Consciousness: awake, drowsy, patient cooperative, and responds to stimulation  Airway & Oxygen Therapy: Patient Spontanous Breathing and Patient connected to face mask oxygen  Post-op Assessment: Report given to RN and Post -op Vital signs reviewed and stable  Post vital signs: Reviewed and stable  Last Vitals:  Vitals Value Taken Time  BP 148/78 01/16/25 15:28  Temp    Pulse 77 01/16/25 15:30  Resp 15 01/16/25 15:30  SpO2 95 % 01/16/25 15:30  Vitals shown include unfiled device data.  Last Pain:  Vitals:   01/16/25 1231  TempSrc:   PainSc: 1          Complications: No notable events documented.

## 2025-01-16 NOTE — Discharge Instructions (Signed)

## 2025-01-16 NOTE — Plan of Care (Signed)
" °  Problem: Education: Goal: Knowledge of General Education information will improve Description: Including pain rating scale, medication(s)/side effects and non-pharmacologic comfort measures 01/16/2025 0713 by Sebastian Columbia, RN Outcome: Progressing 01/16/2025 0711 by Sebastian Columbia, RN Outcome: Progressing   Problem: Health Behavior/Discharge Planning: Goal: Ability to manage health-related needs will improve 01/16/2025 0713 by Sebastian Columbia, RN Outcome: Progressing 01/16/2025 0711 by Sebastian Columbia, RN Outcome: Progressing   Problem: Clinical Measurements: Goal: Ability to maintain clinical measurements within normal limits will improve 01/16/2025 0713 by Sebastian Columbia, RN Outcome: Progressing 01/16/2025 0711 by Sebastian Columbia, RN Outcome: Progressing Goal: Will remain free from infection 01/16/2025 0713 by Sebastian Columbia, RN Outcome: Progressing 01/16/2025 0711 by Sebastian Columbia, RN Outcome: Progressing Goal: Diagnostic test results will improve 01/16/2025 0713 by Sebastian Columbia, RN Outcome: Progressing 01/16/2025 0711 by Sebastian Columbia, RN Outcome: Progressing Goal: Respiratory complications will improve 01/16/2025 0713 by Sebastian Columbia, RN Outcome: Progressing 01/16/2025 0711 by Sebastian Columbia, RN Outcome: Progressing Goal: Cardiovascular complication will be avoided 01/16/2025 0713 by Sebastian Columbia, RN Outcome: Progressing 01/16/2025 0711 by Sebastian Columbia, RN Outcome: Progressing   Problem: Activity: Goal: Risk for activity intolerance will decrease 01/16/2025 0713 by Sebastian Columbia, RN Outcome: Progressing 01/16/2025 0711 by Sebastian Columbia, RN Outcome: Progressing   Problem: Nutrition: Goal: Adequate nutrition will be maintained 01/16/2025 0713 by Sebastian Columbia, RN Outcome: Progressing 01/16/2025 0711 by Sebastian Columbia, RN Outcome: Progressing   Problem: Coping: Goal: Level of anxiety will  decrease 01/16/2025 0713 by Sebastian Columbia, RN Outcome: Progressing 01/16/2025 0711 by Sebastian Columbia, RN Outcome: Progressing   Problem: Elimination: Goal: Will not experience complications related to bowel motility 01/16/2025 0713 by Sebastian Columbia, RN Outcome: Progressing 01/16/2025 0711 by Sebastian Columbia, RN Outcome: Progressing Goal: Will not experience complications related to urinary retention 01/16/2025 0713 by Sebastian Columbia, RN Outcome: Progressing 01/16/2025 0711 by Sebastian Columbia, RN Outcome: Progressing   Problem: Pain Managment: Goal: General experience of comfort will improve and/or be controlled 01/16/2025 0713 by Sebastian Columbia, RN Outcome: Progressing 01/16/2025 0711 by Sebastian Columbia, RN Outcome: Progressing   Problem: Safety: Goal: Ability to remain free from injury will improve 01/16/2025 0713 by Sebastian Columbia, RN Outcome: Progressing 01/16/2025 0711 by Sebastian Columbia, RN Outcome: Progressing   Problem: Skin Integrity: Goal: Risk for impaired skin integrity will decrease 01/16/2025 0713 by Sebastian Columbia, RN Outcome: Progressing 01/16/2025 0711 by Sebastian Columbia, RN Outcome: Progressing   "

## 2025-01-16 NOTE — Op Note (Signed)
 Date: 01/16/25  Patient: Paige Villarreal MRN: [id number]  Preoperative Diagnosis: Acute cholecystitis Postoperative Diagnosis: Same  Procedure: Laparoscopic cholecystectomy with fluorescence ICG cholangiography  Surgeon: Leonor Dawn, MD  EBL: Minimal  Anesthesia: General endotracheal  Specimens: Gallbladder  Indications: [name] is a 59 yo female who presented with acute epigastric abdominal pain and elevated LFTs. RUQ US  was consistent with acute cholecystitis. Her LFTs have downtrended today. After a discussion of the risks and benefits of surgery, she consented to proceed.  Findings: Acute cholecystitis, with occlusion of the cystic duct, which prevented a cholangiogram. There was visible filling of the duodenum with ICG, indicated patency of the common bile duct.  Procedure details: Informed consent was obtained in the preoperative area prior to the procedure. The patient was brought to the operating room and placed on the table in the supine position. General anesthesia was induced and appropriate lines and drains were placed for intraoperative monitoring. Perioperative antibiotics were administered per SCIP guidelines. The abdomen was prepped and draped in the usual sterile fashion. A pre-procedure timeout was taken verifying patient identity, surgical site and procedure to be performed.  A small infraumbilical skin incision was made, the umbilical stalk was grasped and elevated, and a Veress needle was inserted through the fascia. Intraperitoneal placement was confirmed with the saline drop test and the abdomen was insufflated. A 5mm Visiport was placed, and the peritoneal cavity was inspected with no evidence of visceral or vascular injury. A 12mm subxiphoid port was placed, followed by two 5mm ports in the right lateral costal margin, all under direct visualization. The fundus of the gallbladder was grasped and retracted cephalad. There were thin filmy adhesions to the gallbladder, which  were taken down using cautery. The infundibulum was retracted laterally. The cystic triangle was dissected out using cautery and blunt dissection, and the cystic duct and cystic artery were circumferentially dissected out. ICG was used to confirm the biliary anatomy, and the critical view of safety was obtained.  ICG was also visible within the proximal duodenum, indicating patency of common bile duct.  The cystic artery was clipped and divided.  A ductotomy was made on the cystic duct, and multiple attempts were made to cannulate the cystic duct with a 5-Fr ureteral catheter.  The duct was initially cannulated with the tip of the catheter and secured with an Olson clamp.  However when the cholangiogram was attempted, contrast leaked out of the cystic duct with no filling of the central biliary tree.  Further attempts were made to cannulate the cystic duct, however this was not successful as the duct seem to be occluded.  Thus the cholangiogram was aborted.  The cystic duct was divided and two clips were placed on the cystic duct stump.  The gallbladder was taken off the liver using cautery, and the specimen was placed in an Ecosac. The surgical site was irrigated with saline until the effluent was clear. Hemostasis was achieved in the gallbladder fossa using cautery. The cystic duct and artery stumps were visually inspected and there was no evidence of bile leak or bleeding. The specimen was extracted via the subxiphoid port, and the fascia at this port site was closed with a 0 Vicryl suture using a PMI. The abdomen was desufflated and the remaining ports were removed. The skin at all port sites was closed with 4-0 monocryl subcuticular suture. Dermabond was applied.    The patient tolerated the procedure well with no apparent complications.  All counts were correct x2 at  the end of the procedure. The patient was extubated and taken to PACU in stable condition.  Leonor Dawn, MD 01/16/25 4:54 PM

## 2025-01-16 NOTE — H&P (Signed)
 "    Paige Villarreal [birth year]  [id number].    Chief Complaint/Reason for Consult: cholecystitis  HPI:  This is a 59 yo female with a history of DM and overactive bladder who has been having nausea for the last week with no other symptoms until Saturday night she awoke with epigastric and RUQ abdominal pain that radiated to her back.  She had more N/V at this time, but no diarrhea or fevers.  Her pain finally subsided, but she remained nauseated.  Her pain returned again yesterday and this prompted her to come to the ED for evaluation.  She has been found to have significantly elevated LFTs and an US  with gallstones and findings of acute cholecystitis.  We have been asked to see her.  ROS: ROS: see HPI  Family History  Adopted: Yes  Family history unknown: Yes    Past Medical History:  Diagnosis Date   Anxiety    Diabetes mellitus without complication (HCC)    Duodenal ulcer    AGE 16   OAB (overactive bladder)    Urinary incontinence     Past Surgical History:  Procedure Laterality Date   TUBAL LIGATION      Social History:  reports that she quit smoking about 19 years ago. Her smoking use included cigarettes. She smoked an average of 1 pack per day. She has never used smokeless tobacco. She reports that she does not drink alcohol and does not use drugs.  Allergies: Allergies[1]  Medications Prior to Admission  Medication Sig Dispense Refill   5-Hydroxytryptophan (5-HTP PO) Take 1 capsule by mouth daily.     Choline-Inositol-Methionine (LIPO) 50-50-25 MG/ML SOLN Inject 1 Syringe into the muscle once a week.     CRANBERRY PO Take 2-4 tablets by mouth daily as needed (uti).     Cyanocobalamin  (VITAMIN B-12 IJ) Inject 1 Syringe as directed every 30 (thirty) days.     diphenhydrAMINE  (BENADRYL ) 25 MG tablet Take 25 mg by mouth every 6 (six) hours as needed (hives, itching, swelling.).     famotidine  (PEPCID ) 20 MG tablet Take 1 tablet (20 mg total) by mouth daily for 5 days. 5  tablet 0   FLUoxetine  (PROZAC ) 10 MG tablet Take one tablet daily (Patient taking differently: Take 10 mg by mouth daily. ) 90 tablet 4   human chorionic gonadotropin (PREGNYL/NOVAREL) 10000 units injection Inject 10,000 Units into the muscle once.     hydrOXYzine (ATARAX/VISTARIL) 25 MG tablet Take 25-50 mg by mouth See admin instructions. Every 6-8 hours as needed for itching.     methocarbamol  (ROBAXIN ) 500 MG tablet Take 1,000 mg by mouth 3 (three) times daily.     predniSONE  (STERAPRED UNI-PAK 21 TAB) 10 MG (21) TBPK tablet Take by mouth daily. Take 6 tabs by mouth daily  for 2 days, then 5 tabs for 2 days, then 4 tabs for 2 days, then 3 tabs for 2 days, 2 tabs for 2 days, then 1 tab by mouth daily for 2 days 42 tablet 0   tiZANidine  (ZANAFLEX ) 4 MG tablet Take 0.5-1 tablets (2-4 mg total) by mouth every 6 (six) hours as needed for muscle spasms. (Patient not taking: Reported on 06/27/2022) 30 tablet 0   TURMERIC CURCUMIN PO Take 15 mLs by mouth daily.       Physical Exam: Blood pressure (!) 102/58, pulse 76, temperature 98.5 F (36.9 C), temperature source Oral, resp. rate 18, height 5' 1 (1.549 m), weight 61.2 kg, last menstrual period 08/06/2014,  SpO2 97%. General: pleasant, WD, WN white female who is laying in bed in NAD HEENT: head is normocephalic, atraumatic.  Sclera are noninjected.  PERRL.  Ears and nose without any masses or lesions.  Mouth is pink and dry Heart: regular, rate, and rhythm.  Normal s1,s2. No obvious murmurs, gallops, or rubs noted.   Lungs: CTAB, no wheezes, rhonchi, or rales noted.  Respiratory effort nonlabored Abd: soft, minimally tender in RUQ as she has received medications, ND, +BS, no masses, hernias, or organomegaly Psych: A&Ox3 with an appropriate affect.   Results for orders placed or performed during the hospital encounter of 01/15/25 (from the past 48 hours)  Lipase, blood     Status: None   Collection Time: 01/15/25  4:17 PM  Result Value Ref Range    Lipase 44 11 - 51 U/L    Comment: Performed at [facility], [address] [place], [place] [address]  Comprehensive metabolic panel     Status: Abnormal   Collection Time: 01/15/25  4:17 PM  Result Value Ref Range   Sodium 138 135 - 145 mmol/L   Potassium 3.7 3.5 - 5.1 mmol/L   Chloride 101 98 - 111 mmol/L   CO2 22 22 - 32 mmol/L   Glucose, Bld 124 (H) 70 - 99 mg/dL    Comment: Glucose reference range applies only to samples taken after fasting for at least 8 hours.   BUN 7 6 - 20 mg/dL   Creatinine, Ser 9.34 0.44 - 1.00 mg/dL   Calcium 9.4 8.9 - 89.6 mg/dL   Total Protein 7.2 6.5 - 8.1 g/dL   Albumin 4.6 3.5 - 5.0 g/dL   AST 8,606 (H) 15 - 41 U/L   ALT 1,493 (H) 0 - 44 U/L   Alkaline Phosphatase 209 (H) 38 - 126 U/L   Total Bilirubin 2.7 (H) 0.0 - 1.2 mg/dL   GFR, Estimated >39 >39 mL/min    Comment: (NOTE) Calculated using the CKD-EPI Creatinine Equation (2021)    Anion gap 14 5 - 15    Comment: Performed at [facility], [address], [place], [place] [address]  CBC     Status: None   Collection Time: 01/15/25  4:17 PM  Result Value Ref Range   WBC 5.2 4.0 - 10.5 K/uL   RBC 4.69 3.87 - 5.11 MIL/uL   Hemoglobin 14.3 12.0 - 15.0 g/dL   HCT 58.7 63.9 - 53.9 %   MCV 87.8 80.0 - 100.0 fL   MCH 30.5 26.0 - 34.0 pg   MCHC 34.7 30.0 - 36.0 g/dL   RDW 87.4 88.4 - 84.4 %   Platelets 299 150 - 400 K/uL   nRBC 0.0 0.0 - 0.2 %    Comment: Performed at [facility], [address] [place], [place] [address]  Urinalysis, Routine w reflex microscopic -Urine, Clean Catch     Status: Abnormal   Collection Time: 01/15/25  4:17 PM  Result Value Ref Range   Color, Urine YELLOW YELLOW   APPearance CLEAR CLEAR   Specific Gravity, Urine 1.014 1.005 - 1.030   pH 6.5 5.0 - 8.0   Glucose, UA NEGATIVE NEGATIVE mg/dL   Hgb urine dipstick NEGATIVE NEGATIVE   Bilirubin Urine NEGATIVE NEGATIVE   Ketones, ur 15 (A) NEGATIVE  mg/dL   Protein, ur 30 (A) NEGATIVE mg/dL   Nitrite NEGATIVE NEGATIVE   Leukocytes,Ua NEGATIVE NEGATIVE   RBC / HPF 0-5 0 - 5 RBC/hpf   WBC,  UA 0-5 0 - 5 WBC/hpf   Bacteria, UA NONE SEEN NONE SEEN   Squamous Epithelial / HPF 0-5 0 - 5 /HPF   Mucus PRESENT     Comment: Performed at [facility], [address] [place], [place] [address]  HIV Antibody (routine testing w rflx)     Status: None   Collection Time: 01/15/25 11:46 PM  Result Value Ref Range   HIV Screen 4th Generation wRfx Non Reactive Non Reactive    Comment: Performed at [facility], [address] [place], [place] [address]  CBC     Status: None   Collection Time: 01/15/25 11:46 PM  Result Value Ref Range   WBC 6.5 4.0 - 10.5 K/uL   RBC 4.36 3.87 - 5.11 MIL/uL   Hemoglobin 13.4 12.0 - 15.0 g/dL   HCT 60.6 63.9 - 53.9 %   MCV 90.1 80.0 - 100.0 fL   MCH 30.7 26.0 - 34.0 pg   MCHC 34.1 30.0 - 36.0 g/dL   RDW 87.7 88.4 - 84.4 %   Platelets 286 150 - 400 K/uL   nRBC 0.0 0.0 - 0.2 %    Comment: Performed at [facility], [address], [place], [place] [address]  Creatinine, serum     Status: None   Collection Time: 01/15/25 11:46 PM  Result Value Ref Range   Creatinine, Ser 0.68 0.44 - 1.00 mg/dL   GFR, Estimated >39 >39 mL/min    Comment: (NOTE) Calculated using the CKD-EPI Creatinine Equation (2021) Performed at [facility], [address], [place], [place] [address]   Basic metabolic panel with GFR     Status: None   Collection Time: 01/16/25  4:20 AM  Result Value Ref Range   Sodium 139 135 - 145 mmol/L   Potassium 4.0 3.5 - 5.1 mmol/L   Chloride 104 98 - 111 mmol/L   CO2 27 22 - 32 mmol/L   Glucose, Bld 93 70 - 99 mg/dL    Comment: Glucose reference range applies only to samples taken after fasting for at least 8 hours.   BUN 6 6 - 20 mg/dL   Creatinine, Ser 9.27 0.44 - 1.00 mg/dL   Calcium 8.9 8.9 - 89.6 mg/dL   GFR, Estimated >39 >39 mL/min    Comment:  (NOTE) Calculated using the CKD-EPI Creatinine Equation (2021)    Anion gap 9 5 - 15    Comment: Performed at [facility], [address], [place], [place] [address]  CBC     Status: None   Collection Time: 01/16/25  4:20 AM  Result Value Ref Range   WBC 6.2 4.0 - 10.5 K/uL   RBC 4.19 3.87 - 5.11 MIL/uL   Hemoglobin 12.9 12.0 - 15.0 g/dL   HCT 62.1 63.9 - 53.9 %   MCV 90.2 80.0 - 100.0 fL   MCH 30.8 26.0 - 34.0 pg   MCHC 34.1 30.0 - 36.0 g/dL   RDW 87.4 88.4 - 84.4 %   Platelets 259 150 - 400 K/uL   nRBC 0.0 0.0 - 0.2 %    Comment: Performed at [facility], [address] [place], [place] [address]  Hepatic function panel     Status: Abnormal   Collection Time: 01/16/25  4:20 AM  Result Value Ref Range   Total Protein 6.3 (L) 6.5 - 8.1 g/dL   Albumin 4.1 3.5 - 5.0 g/dL   AST 439 (H) 15 -  41 U/L   ALT 982 (H) 0 - 44 U/L   Alkaline Phosphatase 181 (H) 38 - 126 U/L   Total Bilirubin 2.5 (H) 0.0 - 1.2 mg/dL   Bilirubin, Direct 0.9 (H) 0.0 - 0.2 mg/dL   Indirect Bilirubin 1.6 (H) 0.3 - 0.9 mg/dL    Comment: Performed at [facility], [address], [place], [place] [address]   US  Abdomen Limited RUQ (LIVER/GB) Result Date: 01/15/2025 EXAM: Right Upper Quadrant Abdominal Ultrasound 01/15/2025 06:03:00 PM TECHNIQUE: Real-time ultrasonography of the right upper quadrant of the abdomen was performed. COMPARISON: Prior study dated 07/04/2022. CLINICAL HISTORY: RUQ pain. FINDINGS: LIVER: Normal echogenicity. No intrahepatic biliary ductal dilatation. No evidence of mass. Hepatopetal flow in the portal vein. BILIARY SYSTEM: Multiple layering stones are present within the gallbladder, measuring up to 3 mm. The gallbladder wall is thickened to 7 mm with sludge and pericholecystic fluid. The patient was tender over the gallbladder during the study. Findings compatible with acute cholecystitis. Common bile duct normal caliber at 3 mm. RIGHT KIDNEY: No hydronephrosis. No  echogenic calculi. No mass. PANCREAS: Visualized portions of the pancreas are unremarkable. OTHER: No right upper quadrant ascites. IMPRESSION: 1. Cholelithiasis with findings compatible with acute cholecystitis. Electronically signed by: Franky Crease MD 01/15/2025 06:16 PM EST RP Workstation: [id number]      Assessment/Plan Acute cholecystitis with elevated LFTs The patient has been seen, examined, labs, vitals, chart, and imaging personally reviewed.  She appears to have evidence of acute cholecystitis with elevated LFTs.  No CBD dilatation on her US .  Her LFTs are down trending today.  We will plan to proceed with a lap chole with IOC and ICG dye today.  I have started her on Rocephin .  I have explained the procedure, risks, and aftercare of cholecystectomy.  Risks include but are not limited to bleeding, infection, wound problems, anesthesia, diarrhea, bile leak, injury to common bile duct/liver/intestine.  She seems to understand and agrees to proceed.  All questions were answered.    FEN - NPO/IVFs VTE - Lovenox  ID - Rocephin  Admit - obs  DM - SSI Overactive bladder - home med not on formulary.  Will ask pharmacy for assistance with home meds/ MAR.  I reviewed nursing notes, ED provider notes, last 24 h vitals and pain scores, last 48 h intake and output, last 24 h labs and trends, and last 24 h imaging results.  Burnard FORBES Banter, PA-C [facility] 01/16/2025, 10:24 AM Please see Amion for pager number during day hours 7:00am-4:30pm or 7:00am -11:30am on weekends      [1]  Allergies Allergen Reactions   Ciprofloxacin  Rash   Nitrofuran Derivatives Hives    Rash, itching, uricaria, wheals   Oxybutynin Other (See Comments)   Other Rash    Rash, itching, uricaria, wheals   "

## 2025-01-16 NOTE — Anesthesia Procedure Notes (Signed)
 Procedure Name: Intubation Date/Time: 01/16/2025 2:15 PM  Performed by: Taiwana Willison L, CRNAPre-anesthesia Checklist: Patient identified, Emergency Drugs available, Suction available and Patient being monitored Patient Re-evaluated:Patient Re-evaluated prior to induction Oxygen Delivery Method: Circle System Utilized Preoxygenation: Pre-oxygenation with 100% oxygen Induction Type: IV induction Ventilation: Mask ventilation without difficulty Laryngoscope Size: Mac and 3 Grade View: Grade I Tube type: Oral Tube size: 7.0 mm Number of attempts: 1 Airway Equipment and Method: Stylet and Oral airway Placement Confirmation: ETT inserted through vocal cords under direct vision, positive ETCO2 and breath sounds checked- equal and bilateral Secured at: 20 cm Tube secured with: Tape Dental Injury: Teeth and Oropharynx as per pre-operative assessment

## 2025-01-17 ENCOUNTER — Encounter (HOSPITAL_COMMUNITY): Payer: Self-pay | Admitting: Surgery

## 2025-01-17 ENCOUNTER — Other Ambulatory Visit (HOSPITAL_COMMUNITY): Payer: Self-pay

## 2025-01-17 LAB — COMPREHENSIVE METABOLIC PANEL WITH GFR
ALT: 601 U/L — ABNORMAL HIGH (ref 0–44)
AST: 189 U/L — ABNORMAL HIGH (ref 15–41)
Albumin: 4 g/dL (ref 3.5–5.0)
Alkaline Phosphatase: 159 U/L — ABNORMAL HIGH (ref 38–126)
Anion gap: 11 (ref 5–15)
BUN: 9 mg/dL (ref 6–20)
CO2: 26 mmol/L (ref 22–32)
Calcium: 8.9 mg/dL (ref 8.9–10.3)
Chloride: 100 mmol/L (ref 98–111)
Creatinine, Ser: 0.64 mg/dL (ref 0.44–1.00)
GFR, Estimated: >60 mL/min
Glucose, Bld: 101 mg/dL — ABNORMAL HIGH (ref 70–99)
Potassium: 4.2 mmol/L (ref 3.5–5.1)
Sodium: 137 mmol/L (ref 135–145)
Total Bilirubin: 0.7 mg/dL (ref 0.0–1.2)
Total Protein: 6.4 g/dL — ABNORMAL LOW (ref 6.5–8.1)

## 2025-01-17 LAB — CBC
HCT: 35.7 % — ABNORMAL LOW (ref 36.0–46.0)
Hemoglobin: 12.1 g/dL (ref 12.0–15.0)
MCH: 31.1 pg (ref 26.0–34.0)
MCHC: 33.9 g/dL (ref 30.0–36.0)
MCV: 91.8 fL (ref 80.0–100.0)
Platelets: 279 K/uL (ref 150–400)
RBC: 3.89 MIL/uL (ref 3.87–5.11)
RDW: 12.5 % (ref 11.5–15.5)
WBC: 8.5 K/uL (ref 4.0–10.5)
nRBC: 0 % (ref 0.0–0.2)

## 2025-01-17 LAB — GLUCOSE, CAPILLARY: Glucose-Capillary: 167 mg/dL — ABNORMAL HIGH (ref 70–99)

## 2025-01-17 MED ORDER — METHOCARBAMOL 500 MG PO TABS
500.0000 mg | ORAL_TABLET | Freq: Four times a day (QID) | ORAL | Status: DC
  Administered 2025-01-17: 500 mg via ORAL
  Filled 2025-01-17: qty 1

## 2025-01-17 MED ORDER — METHOCARBAMOL 500 MG PO TABS
500.0000 mg | ORAL_TABLET | Freq: Four times a day (QID) | ORAL | 0 refills | Status: AC | PRN
  Filled 2025-01-17: qty 40, 5d supply, fill #0

## 2025-01-17 MED ORDER — IBUPROFEN 200 MG PO TABS: 400.0000 mg | ORAL_TABLET | Freq: Three times a day (TID) | ORAL | Status: AC | PRN

## 2025-01-17 MED ORDER — SODIUM CHLORIDE 0.9 % IV SOLN: INTRAVENOUS | Status: DC

## 2025-01-17 MED ORDER — OXYCODONE HCL 5 MG PO TABS
5.0000 mg | ORAL_TABLET | ORAL | Status: DC | PRN
  Administered 2025-01-17: 5 mg via ORAL
  Filled 2025-01-17: qty 1

## 2025-01-17 MED ORDER — ACETAMINOPHEN 500 MG PO TABS: 1000.0000 mg | ORAL_TABLET | Freq: Four times a day (QID) | ORAL | Status: AC | PRN

## 2025-01-17 MED ORDER — OXYCODONE HCL 5 MG PO TABS
5.0000 mg | ORAL_TABLET | Freq: Four times a day (QID) | ORAL | 0 refills | Status: DC | PRN
  Filled 2025-01-17: qty 15, 4d supply, fill #0

## 2025-01-17 NOTE — Discharge Summary (Signed)
 [facility] Discharge Summary   Patient ID: Paige Villarreal MRN: [id number] DOB/AGE: 57/12/1965 57 y.o.  Admit date: 01/15/2025 Discharge date: 01/17/2025  Admitting Diagnosis: Cholecystitis   Discharge Diagnosis Patient Active Problem List   Diagnosis Date Noted   Acute cholecystitis 01/15/2025   Allergic drug rash 03/15/2020   Constipation 09/22/2012   Oligomenorrhea 09/22/2012   Leg cramps 07/22/2012   SUI (stress urinary incontinence), female 07/22/2012   Vitamin D  deficiency 07/22/2012   Anxiety 07/22/2012   Female cystocele 07/22/2012   Perimenopause 07/22/2012   History of gestational diabetes 04/30/2012    Consultants None   Imaging: DG Cholangiogram Operative Result Date: 01/17/2025 CLINICAL DATA:  Cholecystectomy. EXAM: INTRAOPERATIVE CHOLANGIOGRAM TECHNIQUE: Cholangiographic images from the C-arm fluoroscopic device were submitted for interpretation post-operatively. Please see the procedural report for the amount of contrast and the fluoroscopy time utilized. FLUOROSCOPY: Radiation Exposure Index (as provided by the fluoroscopic device): 1.3 mGy Kerma COMPARISON:  Right upper quadrant ultrasound 01/16/2024 FINDINGS: Cholangiogram imaging was obtained intraoperatively with a C-arm. The biliary tree was not opacified with contrast injection with focal contrast extravasation noted. IMPRESSION: Nonvisualization of the biliary tree with focal contrast extravasation noted. Electronically Signed   By: Marcey Moan M.D.   On: 01/17/2025 07:51   US  Abdomen Limited RUQ (LIVER/GB) Result Date: 01/15/2025 EXAM: Right Upper Quadrant Abdominal Ultrasound 01/15/2025 06:03:00 PM TECHNIQUE: Real-time ultrasonography of the right upper quadrant of the abdomen was performed. COMPARISON: Prior study dated 07/04/2022. CLINICAL HISTORY: RUQ pain. FINDINGS: LIVER: Normal echogenicity. No intrahepatic biliary ductal dilatation. No evidence of mass. Hepatopetal flow in the portal vein.  BILIARY SYSTEM: Multiple layering stones are present within the gallbladder, measuring up to 3 mm. The gallbladder wall is thickened to 7 mm with sludge and pericholecystic fluid. The patient was tender over the gallbladder during the study. Findings compatible with acute cholecystitis. Common bile duct normal caliber at 3 mm. RIGHT KIDNEY: No hydronephrosis. No echogenic calculi. No mass. PANCREAS: Visualized portions of the pancreas are unremarkable. OTHER: No right upper quadrant ascites. IMPRESSION: 1. Cholelithiasis with findings compatible with acute cholecystitis. Electronically signed by: Franky Crease MD 01/15/2025 06:16 PM EST RP Workstation: [id number]    Procedures Dr  Dasie 01/16/25 - laparoscopic cholecystectomy with ICG  Hospital Course:  59 y/o F who presented to [facility] with nausea followed by epigastric and RUQ pain.  Workup showed cholecystitis and elevated LFTs.  Patient was admitted, repeat LFTs were down-trending, and underwent procedure listed above.  Tolerated procedure well and was transferred to the floor.  Diet was advanced as tolerated.  On POD#1, the patient was voiding well, tolerating diet, ambulating well, pain well controlled, vital signs stable, incisions c/d/i and felt stable for discharge home.  Patient will follow up in our office as below and knows to call with questions or concerns.    I have personally reviewed the patients medication history on the [place] controlled substance database.    Physical Exam: General:  Alert, NAD, pleasant, comfortable Abd:  Soft, ND, mild tenderness, incisions C/D/I  Allergies as of 01/17/2025       Reactions   Ditropan Xl [oxybutynin] Hives, Itching   Nitrofuran Derivatives Hives, Itching, Rash   Crestor [rosuvastatin] Other (See Comments)   Confusion Memory loss   Pravastatin Other (See Comments)   Confusion Memory loss   Cipro  [ciprofloxacin  Hcl] Rash        Medication List     TAKE these medications    acetaminophen   500 MG  tablet Commonly known as: TYLENOL  Take 2 tablets (1,000 mg total) by mouth every 6 (six) hours as needed. What changed:  when to take this reasons to take this   b complex vitamins capsule Take 1 capsule by mouth daily after supper.   escitalopram  20 MG tablet Commonly known as: LEXAPRO  Take 20 mg by mouth daily after supper.   ibuprofen  200 MG tablet Commonly known as: ADVIL  Take 2-4 tablets (400-800 mg total) by mouth every 8 (eight) hours as needed for headache, mild pain (pain score 1-3) or moderate pain (pain score 4-6) (pain). What changed:  how much to take when to take this reasons to take this   methocarbamol  500 MG tablet Commonly known as: ROBAXIN  Take 1-2 tablets (500-1,000 mg total) by mouth every 6 (six) hours as needed for muscle spasms.   Multivitamin Women 50+ Tabs Take 1 tablet by mouth daily after supper.   oxyCODONE  5 MG immediate release tablet Commonly known as: Oxy IR/ROXICODONE  Take 1 tablet (5 mg total) by mouth every 6 (six) hours as needed for severe pain (pain score 7-10) (not releived by tylenol , advil , or robaxin ).   Ozempic (1 MG/DOSE) 4 MG/3ML Sopn Generic drug: Semaglutide (1 MG/DOSE) Inject 1 mg into the skin every Wednesday.   solifenacin 5 MG tablet Commonly known as: VESICARE Take 5 mg by mouth daily.   VITAMIN D -3 PO Take 1 capsule by mouth daily after supper.          Follow-up Information     Maczis, Puja Gosai, PA-C Follow up in 3 week(s).   Specialty: General Surgery Why: Arrive 30 minutes prior to your appointment time, Please bring your insurance card and photo ID Contact information: [address] [facility] [place] [address] [phone]                 Signed: Almarie Pringle, [facility] 01/17/2025, 9:13 AM

## 2025-01-17 NOTE — Anesthesia Postprocedure Evaluation (Signed)
"   Anesthesia Post Note  Patient: Crystallynn L [place]  Procedure(s) Performed: LAPAROSCOPIC CHOLECYSTECTOMY (Abdomen) INDOCYANINE GREEN  FLUORESCENCE IMAGING (ICG) (Abdomen)     Patient location during evaluation: PACU Anesthesia Type: General Level of consciousness: awake and alert Pain management: pain level controlled Vital Signs Assessment: post-procedure vital signs reviewed and stable Respiratory status: spontaneous breathing, nonlabored ventilation and respiratory function stable Cardiovascular status: blood pressure returned to baseline and stable Postop Assessment: no apparent nausea or vomiting Anesthetic complications: no   No notable events documented.                Genowefa Morga      "

## 2025-01-17 NOTE — Progress Notes (Signed)
 Discharge   Patient expressed verbal understanding of discharge POC.   Patient given time to ask any questions.  Additional education included in AVS.  Alert oriented in good spirits.   PIV removed. Pressure dressings intact.  All personal belongings at bedtime.  Transferring to [facility] pharm and Main [facility]

## 2025-01-17 NOTE — Plan of Care (Signed)
" °  Problem: Education: Goal: Knowledge of General Education information will improve Description: Including pain rating scale, medication(s)/side effects and non-pharmacologic comfort measures Outcome: Progressing   Problem: Health Behavior/Discharge Planning: Goal: Ability to manage health-related needs will improve Outcome: Progressing   Problem: Clinical Measurements: Goal: Ability to maintain clinical measurements within normal limits will improve Outcome: Progressing Goal: Will remain free from infection Outcome: Progressing Goal: Diagnostic test results will improve Outcome: Progressing Goal: Respiratory complications will improve Outcome: Progressing Goal: Cardiovascular complication will be avoided Outcome: Progressing   Problem: Activity: Goal: Risk for activity intolerance will decrease Outcome: Progressing   Problem: Nutrition: Goal: Adequate nutrition will be maintained Outcome: Progressing   Problem: Coping: Goal: Level of anxiety will decrease Outcome: Progressing   Problem: Elimination: Goal: Will not experience complications related to bowel motility Outcome: Progressing Goal: Will not experience complications related to urinary retention Outcome: Progressing   Problem: Pain Managment: Goal: General experience of comfort will improve and/or be controlled Outcome: Progressing   Problem: Safety: Goal: Ability to remain free from injury will improve Outcome: Progressing   Problem: Skin Integrity: Goal: Risk for impaired skin integrity will decrease Outcome: Progressing   Problem: Education: Goal: Ability to describe self-care measures that may prevent or decrease complications (Diabetes Survival Skills Education) will improve Outcome: Progressing Goal: Individualized Educational Video(s) Outcome: Progressing   Problem: Coping: Goal: Ability to adjust to condition or change in health will improve Outcome: Progressing   Problem: Fluid  Volume: Goal: Ability to maintain a balanced intake and output will improve Outcome: Progressing   Problem: Health Behavior/Discharge Planning: Goal: Ability to identify and utilize available resources and services will improve Outcome: Progressing Goal: Ability to manage health-related needs will improve Outcome: Progressing   Problem: Metabolic: Goal: Ability to maintain appropriate glucose levels will improve Outcome: Progressing   Problem: Nutritional: Goal: Maintenance of adequate nutrition will improve Outcome: Progressing Goal: Progress toward achieving an optimal weight will improve Outcome: Progressing   Problem: Skin Integrity: Goal: Risk for impaired skin integrity will decrease Outcome: Progressing   Problem: Tissue Perfusion: Goal: Adequacy of tissue perfusion will improve Outcome: Progressing   Problem: Education: Goal: Knowledge of the prescribed therapeutic regimen will improve Outcome: Progressing   Problem: Bowel/Gastric: Goal: Gastrointestinal status for postoperative course will improve Outcome: Progressing   Problem: Cardiac: Goal: Ability to maintain an adequate cardiac output Outcome: Progressing Goal: Will show no evidence of cardiac arrhythmias Outcome: Progressing   Problem: Nutritional: Goal: Will attain and maintain optimal nutritional status Outcome: Progressing   Problem: Neurological: Goal: Will regain or maintain usual level of consciousness Outcome: Progressing   Problem: Clinical Measurements: Goal: Ability to maintain clinical measurements within normal limits Outcome: Progressing Goal: Postoperative complications will be avoided or minimized Outcome: Progressing   Problem: Respiratory: Goal: Will regain and/or maintain adequate ventilation Outcome: Progressing Goal: Respiratory status will improve Outcome: Progressing   Problem: Skin Integrity: Goal: Demonstrates signs of wound healing without infection Outcome:  Progressing   Problem: Urinary Elimination: Goal: Will remain free from infection Outcome: Progressing Goal: Ability to achieve and maintain adequate urine output Outcome: Progressing   "

## 2025-01-18 LAB — SURGICAL PATHOLOGY

## 2025-01-23 ENCOUNTER — Other Ambulatory Visit: Payer: Self-pay

## 2025-01-23 ENCOUNTER — Emergency Department (HOSPITAL_BASED_OUTPATIENT_CLINIC_OR_DEPARTMENT_OTHER)
Admission: EM | Admit: 2025-01-23 | Discharge: 2025-01-23 | Disposition: A | Discharge status: Home / Self Care | Attending: Emergency Medicine | Admitting: Emergency Medicine

## 2025-01-23 ENCOUNTER — Emergency Department (HOSPITAL_BASED_OUTPATIENT_CLINIC_OR_DEPARTMENT_OTHER)

## 2025-01-23 ENCOUNTER — Encounter (HOSPITAL_BASED_OUTPATIENT_CLINIC_OR_DEPARTMENT_OTHER): Payer: Self-pay | Admitting: Emergency Medicine

## 2025-01-23 DIAGNOSIS — G8918 Other acute postprocedural pain: Secondary | ICD-10-CM | POA: Diagnosis present

## 2025-01-23 LAB — LIPASE, BLOOD: Lipase: 59 U/L — ABNORMAL HIGH (ref 11–51)

## 2025-01-23 LAB — COMPREHENSIVE METABOLIC PANEL WITH GFR
ALT: 142 U/L — ABNORMAL HIGH (ref 0–44)
AST: 132 U/L — ABNORMAL HIGH (ref 15–41)
Albumin: 4.4 g/dL (ref 3.5–5.0)
Alkaline Phosphatase: 198 U/L — ABNORMAL HIGH (ref 38–126)
Anion gap: 13 (ref 5–15)
BUN: 9 mg/dL (ref 6–20)
CO2: 26 mmol/L (ref 22–32)
Calcium: 9.6 mg/dL (ref 8.9–10.3)
Chloride: 101 mmol/L (ref 98–111)
Creatinine, Ser: 0.55 mg/dL (ref 0.44–1.00)
GFR, Estimated: >60 mL/min
Glucose, Bld: 114 mg/dL — ABNORMAL HIGH (ref 70–99)
Potassium: 3.9 mmol/L (ref 3.5–5.1)
Sodium: 139 mmol/L (ref 135–145)
Total Bilirubin: 0.4 mg/dL (ref 0.0–1.2)
Total Protein: 7 g/dL (ref 6.5–8.1)

## 2025-01-23 LAB — URINALYSIS, ROUTINE W REFLEX MICROSCOPIC
Bilirubin Urine: NEGATIVE
Glucose, UA: NEGATIVE mg/dL
Hgb urine dipstick: NEGATIVE
Ketones, ur: NEGATIVE mg/dL
Leukocytes,Ua: NEGATIVE
Nitrite: NEGATIVE
Protein, ur: NEGATIVE mg/dL
Specific Gravity, Urine: 1.005 (ref 1.005–1.030)
pH: 7 (ref 5.0–8.0)

## 2025-01-23 LAB — CBC
HCT: 36.5 % (ref 36.0–46.0)
Hemoglobin: 12.3 g/dL (ref 12.0–15.0)
MCH: 30.4 pg (ref 26.0–34.0)
MCHC: 33.7 g/dL (ref 30.0–36.0)
MCV: 90.1 fL (ref 80.0–100.0)
Platelets: 375 10*3/uL (ref 150–400)
RBC: 4.05 MIL/uL (ref 3.87–5.11)
RDW: 12.5 % (ref 11.5–15.5)
WBC: 6.7 10*3/uL (ref 4.0–10.5)
nRBC: 0 % (ref 0.0–0.2)

## 2025-01-23 MED ORDER — SODIUM CHLORIDE 0.9 % IV BOLUS
500.0000 mL | Freq: Once | INTRAVENOUS | Status: AC
  Administered 2025-01-23: 500 mL via INTRAVENOUS

## 2025-01-23 MED ORDER — IOHEXOL 300 MG/ML  SOLN
100.0000 mL | Freq: Once | INTRAMUSCULAR | Status: AC | PRN
  Administered 2025-01-23: 100 mL via INTRAVENOUS

## 2025-01-23 MED ORDER — ONDANSETRON HCL 4 MG/2ML IJ SOLN
4.0000 mg | Freq: Once | INTRAMUSCULAR | Status: AC
  Administered 2025-01-23: 4 mg via INTRAVENOUS
  Filled 2025-01-23: qty 2

## 2025-01-23 MED ORDER — MORPHINE SULFATE (PF) 4 MG/ML IV SOLN
4.0000 mg | Freq: Once | INTRAVENOUS | Status: AC
  Administered 2025-01-23: 4 mg via INTRAVENOUS
  Filled 2025-01-23: qty 1

## 2025-01-23 MED ORDER — ONDANSETRON 4 MG PO TBDP: 4.0000 mg | ORAL_TABLET | Freq: Three times a day (TID) | ORAL | 0 refills | Status: AC | PRN

## 2025-01-23 MED ORDER — OXYCODONE HCL 5 MG PO TABS: 5.0000 mg | ORAL_TABLET | Freq: Four times a day (QID) | ORAL | 0 refills | Status: AC | PRN

## 2025-01-23 NOTE — ED Provider Notes (Signed)
 " [place] [facility]ERGENCY DEPARTMENT AT [facility] Provider Note   CSN: [id number] Arrival date & time: 01/23/25  1947    Patient presents with: Post-op Problem and Abdominal Pain   Paige Villarreal is a 59 y.o. female here for evaluation abdominal pain and nausea.  Had cholecystectomy with intraoperative cholangiogram 01/16/25.  Was doing well postop until today developed severe epigastric and right upper quadrant abdominal pain and nausea.  Has not had to use pain medication or nausea medication at home.  No chest pain, shortness of breath, fever, bloody stool, dysuria, hematuria.  No pain or swelling to lower legs  Took 800 mg Tylenol , 800 mg ibuprofen  PTA without relief.   HPI     Prior to Admission medications  Medication Sig Start Date End Date Taking? Authorizing Provider  ondansetron  (ZOFRAN -ODT) 4 MG disintegrating tablet Take 1 tablet (4 mg total) by mouth every 8 (eight) hours as needed. 01/23/25  Yes Terrin Imparato A, PA-C  acetaminophen  (TYLENOL ) 500 MG tablet Take 2 tablets (1,000 mg total) by mouth every 6 (six) hours as needed. 01/17/25   Augustus Almarie RAMAN, PA-C  b complex vitamins capsule Take 1 capsule by mouth daily after supper.    [provider]  Cholecalciferol (VITAMIN D -3 PO) Take 1 capsule by mouth daily after supper.    [provider]  escitalopram  (LEXAPRO ) 20 MG tablet Take 20 mg by mouth daily after supper.    [provider]  ibuprofen  (ADVIL ) 200 MG tablet Take 2-4 tablets (400-800 mg total) by mouth every 8 (eight) hours as needed for headache, mild pain (pain score 1-3) or moderate pain (pain score 4-6) (pain). 01/17/25   Augustus Almarie RAMAN, PA-C  methocarbamol  (ROBAXIN ) 500 MG tablet Take 1-2 tablets (500-1,000 mg total) by mouth every 6 (six) hours as needed for muscle spasms. 01/17/25   Augustus Almarie RAMAN, PA-C  Multiple Vitamins-Minerals (MULTIVITAMIN WOMEN 50+) TABS Take 1 tablet by mouth daily after supper.    [provider]  oxyCODONE  (OXY IR/ROXICODONE ) 5 MG immediate release tablet Take 1 tablet (5 mg total) by mouth every 6 (six) hours as needed for severe pain (pain score 7-10) (not relieved by tylenol , advil , or robaxin ). 01/23/25   Lucile Hillmann A, PA-C  Semaglutide, 1 MG/DOSE, (OZEMPIC, 1 MG/DOSE,) 4 MG/3ML SOPN Inject 1 mg into the skin every Wednesday.    [provider]  solifenacin (VESICARE) 5 MG tablet Take 5 mg by mouth daily.    [provider]    Allergies: Ditropan xl [oxybutynin], Nitrofuran derivatives, Crestor [rosuvastatin], Pravastatin, and Cipro  [ciprofloxacin  hcl]    Review of Systems  Constitutional: Negative.   HENT: Negative.  Negative for facial swelling.   Respiratory: Negative.    Cardiovascular: Negative.   Gastrointestinal:  Positive for abdominal pain, nausea and vomiting. Negative for abdominal distention, anal bleeding, blood in stool, constipation, diarrhea and rectal pain.  Genitourinary: Negative.   Musculoskeletal: Negative.   Skin: Negative.   Neurological: Negative.     Updated Vital Signs BP (!) 176/87 (BP Location: Right Arm)   Pulse (!) 59   Temp 98.3 F (36.8 C)   Resp 20   Ht 5' 1 (1.549 m)   Wt 61 kg   LMP 08/06/2014   SpO2 100%   BMI 25.41 kg/m   Physical Exam Vitals and nursing note reviewed.  Constitutional:      General: She is not in acute distress.    Appearance: She is well-developed. She is  not ill-appearing, toxic-appearing or diaphoretic.  HENT:     Head: Atraumatic.  Eyes:     Pupils: Pupils are equal, round, and reactive to light.  Cardiovascular:     Rate and Rhythm: Normal rate.     Heart sounds: Normal heart sounds.  Pulmonary:     Effort: Pulmonary effort is normal. No respiratory distress.     Breath sounds: Normal breath sounds.  Abdominal:     General: Bowel sounds are normal. There is no distension.     Palpations: Abdomen is soft.     Tenderness: There is abdominal tenderness in the  right upper quadrant and epigastric area.     Comments: Laparoscopic incisions C/D/I.  Soft, diffusely tender to epigastric and right upper quadrant.  Musculoskeletal:        General: Normal range of motion.     Cervical back: Normal range of motion.  Skin:    General: Skin is warm and dry.  Neurological:     General: No focal deficit present.     Mental Status: She is alert.  Psychiatric:        Mood and Affect: Mood normal.     (all labs ordered are listed, but only abnormal results are displayed) Labs Reviewed  LIPASE, BLOOD - Abnormal; Notable for the following components:      Result Value   Lipase 59 (*)    All other components within normal limits  COMPREHENSIVE METABOLIC PANEL WITH GFR - Abnormal; Notable for the following components:   Glucose, Bld 114 (*)    AST 132 (*)    ALT 142 (*)    Alkaline Phosphatase 198 (*)    All other components within normal limits  URINALYSIS, ROUTINE W REFLEX MICROSCOPIC - Abnormal; Notable for the following components:   Color, Urine COLORLESS (*)    All other components within normal limits  CBC    EKG: None  Radiology: CT ABDOMEN PELVIS W CONTRAST Result Date: 01/23/2025 EXAM: CT ABDOMEN AND PELVIS WITH CONTRAST 01/23/2025 09:14:09 PM TECHNIQUE: CT of the abdomen and pelvis was performed with the administration of 100 mL of iohexol  (OMNIPAQUE ) 300 MG/ML solution. Multiplanar reformatted images are provided for review. Automated exposure control, iterative reconstruction, and/or weight-based adjustment of the mA/kV was utilized to reduce the radiation dose to as low as reasonably achievable. COMPARISON: None available. CLINICAL HISTORY: Cholecystectomy 7 days ago with persistent epigastric pain. FINDINGS: LOWER CHEST: No acute abnormality. LIVER: The liver is unremarkable. GALLBLADDER AND BILE DUCTS: Changes consistent with prior cholecystectomy are noted. Mild intrahepatic and extrahepatic ductal dilatation is noted, consistent with  the post-cholecystectomy state. No definitive calculi are seen. No fluid collection is noted in the gallbladder fossa. SPLEEN: No acute abnormality. PANCREAS: No acute abnormality. ADRENAL GLANDS: No acute abnormality. KIDNEYS, URETERS AND BLADDER: Kidneys show no renal calculi or obstructive changes. No hydronephrosis. No perinephric or periureteral stranding. Delayed images demonstrate normal excretion. The bladder is decompressed. GI AND BOWEL: Stomach demonstrates no acute abnormality. Small bowel is unremarkable. No obstructive or inflammatory changes of the colon are seen. The appendix is within normal limits. There is no bowel obstruction. PERITONEUM AND RETROPERITONEUM: No ascites. No free air. VASCULATURE: Aorta is normal in caliber. Aortic calcifications are seen without aneurysmal dilatation. LYMPH NODES: No lymphadenopathy. REPRODUCTIVE ORGANS: The uterus is within normal limits. BONES AND SOFT TISSUES: No acute osseous abnormality. No focal soft tissue abnormality. IMPRESSION: 1. Changes consistent with prior cholecystectomy with mild intrahepatic and extrahepatic ductal dilatation, without definitive  calculi or fluid collection in the gallbladder fossa. 2. No acute findings in the abdomen or pelvis. Electronically signed by: Oneil Devonshire MD 01/23/2025 09:21 PM EST RP Workstation: [id number]     Procedures   Medications Ordered in the ED  morphine  (PF) 4 MG/ML injection 4 mg (4 mg Intravenous Given 01/23/25 2036)  ondansetron  (ZOFRAN ) injection 4 mg (4 mg Intravenous Given 01/23/25 2036)  sodium chloride  0.9 % bolus 500 mL (500 mLs Intravenous New Bag/Given 01/23/25 2036)  iohexol  (OMNIPAQUE ) 300 MG/ML solution 100 mL (100 mLs Intravenous Contrast Given 01/23/25 3750)   59 year old here for evaluation of abdominal pain and vomiting after having gallbladder removed about 1 week ago.  Was doing otherwise well postop until earlier today she developed sudden onset epigastric and right upper quadrant  abdominal pain with nausea and vomiting.  Tylenol  Motrin  PTA without relief.  Called the on-call surgeon who recommended he come here for labs and imaging.  No chest pain, shortness of breath.  She is without tachycardia, tachypnea or hypoxia.  Does not appear grossly fluid overloaded.  Low suspicion for pneumonia, pneumothorax, PE.  Her incisions are clean, dry, intact.  Here she is afebrile however is tender to epigastric and right upper quadrant.  Will plan on pain control, labs, imaging, reassess  Labs and imaging personally viewed and interpreted:  CBC without leukocytosis Metabolic panel AST 132, ALT 142>>> downtrend from admission. Alk phos 198-up from 159 on discharge Lipase 59 UA negative for infection CT abdomen pelvis shows changes consistent with prior cholecystectomy with mild intrahepatic and extrahepatic duct dilation without definitive calculi or fluid collection   Discussed results with patient at bedside.  Pain improved now only 1 or 2.  Will discuss with on-call surgery  Clinical Course as of 01/23/25 2146  Mon Jan 23, 2025  2136 Discussed with Dr. Sebastian, on-call for general surgery.  He reviewed her labs and imaging.  Recommends DC home with pain control, antiemetics, follow-up in office [BH]    Clinical Course User Index [BH] Sadiyah Kangas A, PA-C    Patient reassessed.  Pain controlled.  Tolerating p.o. intake.  Discussed Dr. Sebastian with general surgery recommendation.  Patient comfortable with this.  Will DC home with additional pain medicine as she has recently run out of her oxycodone  as well as Zofran .  Discussed bland diet, close follow-up with general surgery outpatient, return for any new or worsening symptoms.    The patient has been appropriately medically screened and/or stabilized in the ED. I have low suspicion for any other emergent medical condition which would require further screening, evaluation or treatment in the ED or require inpatient  management.  Patient is hemodynamically stable and in no acute distress.  Patient able to ambulate in department prior to ED.  Evaluation does not show acute pathology that would require ongoing or additional emergent interventions while in the emergency department or further inpatient treatment.  I have discussed the diagnosis with the patient and answered all questions.  Pain is been managed while in the emergency department and patient has no further complaints prior to discharge.  Patient is comfortable with plan discussed in room and is stable for discharge at this time.  I have discussed strict return precautions for returning to the emergency department.  Patient was encouraged to follow-up with PCP/specialist refer to at discharge.  Medical Decision Making Amount and/or Complexity of Data Reviewed Independent Historian: friend External Data Reviewed: labs, radiology and notes. Labs: ordered. Decision-making details documented in ED Course. Radiology: ordered and independent interpretation performed. Decision-making details documented in ED Course.  Risk OTC drugs. Prescription drug management. Decision regarding hospitalization. Diagnosis or treatment significantly limited by social determinants of health.       Final diagnoses:  Post-op pain    ED Discharge Orders          Ordered    oxyCODONE  (OXY IR/ROXICODONE ) 5 MG immediate release tablet  Every 6 hours PRN        01/23/25 2145    ondansetron  (ZOFRAN -ODT) 4 MG disintegrating tablet  Every 8 hours PRN        01/23/25 2145               Karol Skarzynski A, PA-C 01/23/25 2147  "

## 2025-01-23 NOTE — ED Triage Notes (Signed)
 Patient here post gall bladder removal last Monday and d/c'd the next day. Post operatively everything has been fine since all of a sudden she had a sharp upper abdominal pain. She called the on call surgeon who advised her to come. She's now also nauseated. No episodes of emesis.

## 2025-01-23 NOTE — ED Notes (Signed)
 Patient transported to CT

## 2025-01-23 NOTE — Discharge Instructions (Addendum)
 It was a pleasure to care of you here today  As we discussed your lab work and your CT imaging were reassuring  We discussed with Dr. Sebastian with general surgery who is comfortable with you going home.  I have refilled your pain medication and nausea medication  As we discussed in the room this is in the opiate family does have the addictive potential may make you sleepy.  Make sure to follow-up outpatient, return for new or worsening symptoms
